# Patient Record
Sex: Female | Born: 1965 | Race: White | Hispanic: No | Marital: Married | Smoking: Never smoker
Health system: Southern US, Community
[De-identification: ages and names within clinical notes are randomized; demographics above are authoritative.]

## PROBLEM LIST (undated history)

## (undated) DIAGNOSIS — R509 Fever, unspecified: Secondary | ICD-10-CM

## (undated) DIAGNOSIS — G43909 Migraine, unspecified, not intractable, without status migrainosus: Secondary | ICD-10-CM

## (undated) DIAGNOSIS — Z87442 Personal history of urinary calculi: Secondary | ICD-10-CM

## (undated) DIAGNOSIS — E669 Obesity, unspecified: Secondary | ICD-10-CM

## (undated) DIAGNOSIS — R011 Cardiac murmur, unspecified: Secondary | ICD-10-CM

## (undated) DIAGNOSIS — M545 Low back pain, unspecified: Secondary | ICD-10-CM

## (undated) DIAGNOSIS — I10 Essential (primary) hypertension: Secondary | ICD-10-CM

## (undated) DIAGNOSIS — R609 Edema, unspecified: Secondary | ICD-10-CM

## (undated) DIAGNOSIS — J302 Other seasonal allergic rhinitis: Secondary | ICD-10-CM

## (undated) DIAGNOSIS — J189 Pneumonia, unspecified organism: Secondary | ICD-10-CM

## (undated) DIAGNOSIS — N950 Postmenopausal bleeding: Secondary | ICD-10-CM

## (undated) DIAGNOSIS — S99911A Unspecified injury of right ankle, initial encounter: Secondary | ICD-10-CM

## (undated) DIAGNOSIS — D649 Anemia, unspecified: Secondary | ICD-10-CM

## (undated) HISTORY — PX: CHOLECYSTECTOMY: SHX55

## (undated) HISTORY — DX: Essential (primary) hypertension: I10

---

## 2002-09-22 HISTORY — PX: TUBAL LIGATION: SHX77

## 2013-09-22 DIAGNOSIS — J189 Pneumonia, unspecified organism: Secondary | ICD-10-CM

## 2013-09-22 HISTORY — DX: Pneumonia, unspecified organism: J18.9

## 2017-11-02 ENCOUNTER — Ambulatory Visit (INDEPENDENT_AMBULATORY_CARE_PROVIDER_SITE_OTHER): Payer: BLUE CROSS/BLUE SHIELD | Admitting: Obstetrics & Gynecology

## 2017-11-02 ENCOUNTER — Ambulatory Visit (INDEPENDENT_AMBULATORY_CARE_PROVIDER_SITE_OTHER): Payer: BLUE CROSS/BLUE SHIELD

## 2017-11-02 ENCOUNTER — Encounter: Payer: Self-pay | Admitting: Obstetrics & Gynecology

## 2017-11-02 VITALS — BP 191/100 | Wt 279.0 lb

## 2017-11-02 DIAGNOSIS — Z124 Encounter for screening for malignant neoplasm of cervix: Secondary | ICD-10-CM

## 2017-11-02 DIAGNOSIS — Z1151 Encounter for screening for human papillomavirus (HPV): Secondary | ICD-10-CM

## 2017-11-02 DIAGNOSIS — Z01411 Encounter for gynecological examination (general) (routine) with abnormal findings: Secondary | ICD-10-CM

## 2017-11-02 DIAGNOSIS — N950 Postmenopausal bleeding: Secondary | ICD-10-CM

## 2017-11-02 DIAGNOSIS — R9389 Abnormal findings on diagnostic imaging of other specified body structures: Secondary | ICD-10-CM

## 2017-11-02 DIAGNOSIS — Z01419 Encounter for gynecological examination (general) (routine) without abnormal findings: Secondary | ICD-10-CM

## 2017-11-02 MED ORDER — MISOPROSTOL 200 MCG PO TABS
ORAL_TABLET | ORAL | 0 refills | Status: DC
Start: 2017-11-02 — End: 2017-12-11

## 2017-11-02 MED ORDER — MISOPROSTOL 200 MCG PO TABS
200.0000 ug | ORAL_TABLET | Freq: Four times a day (QID) | ORAL | 0 refills | Status: DC
Start: 2017-11-02 — End: 2017-11-02

## 2017-11-02 NOTE — Progress Notes (Signed)
Subjective:    Alyssa Cherry is a 52 y.o. married P4 (32, 43, 65, and 75 yo kids) female who presents for an annual exam. She had no bleeding/periods for "11 1/2 months" but has had an extended period of bleeding last month (10-02-17 through 10-20-17. The patient is sexually active (q Friday :)). GYN screening history: last pap: was normal. The patient wears seatbelts: yes. The patient participates in regular exercise: no. Has the patient ever been transfused or tattooed?: no. The patient reports that there is not domestic violence in her life.   Menstrual History: OB History    No data available      Menarche age: 56 Patient's last menstrual period was 10/02/2017.    The following portions of the patient's history were reviewed and updated as appropriate: allergies, current medications, past family history, past medical history, past social history, past surgical history and problem list.  Review of Systems Pertinent items are noted in HPI.   FH-+ breast cancer in her 40 yo mom, and mom's sister who died at 33 years old +ovarian cancer in paternal aunt, + colon cancer- brother died at 61 years old  She had a colonoscopy last year, normal I tied her tubes in 2004. 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 Flu shot already given 10/18 Objective:    Wt 279 lb (126.6 kg)   LMP 10/02/2017   General Appearance:    Alert, cooperative, no distress, appears stated age  Head:    Normocephalic, without obvious abnormality, atraumatic  Eyes:    PERRL, conjunctiva/corneas clear, EOM's intact, fundi    benign, both eyes  Ears:    Normal TM's and external ear canals, both ears  Nose:   Nares normal, septum midline, mucosa normal, no drainage    or sinus tenderness  Throat:   Lips, mucosa, and tongue normal; teeth and gums normal  Neck:   Supple, symmetrical, trachea midline, no adenopathy;    thyroid:  no enlargement/tenderness/nodules; no carotid   bruit or JVD  Back:      Symmetric, no curvature, ROM normal, no CVA tenderness  Lungs:     Clear to auscultation bilaterally, respirations unlabored  Chest Wall:    No tenderness or deformity   Heart:    Regular rate and rhythm, S1 and S2 normal, no murmur, rub   or gallop  Breast Exam:    No tenderness, masses, or nipple abnormality  Abdomen:     Soft, non-tender, bowel sounds active all four quadrants,    no masses, no organomegaly  Genitalia:    Normal female without lesion, discharge or tenderness     Extremities:   Extremities normal, atraumatic, no cyanosis or edema  Pulses:   2+ and symmetric all extremities  Skin:   Skin color, texture, turgor normal, no rashes or lesions  Lymph nodes:   Cervical, supraclavicular, and axillary nodes normal  Neurologic:   CNII-XII intact, normal strength, sensation and reflexes    throughout  .    Assessment:    Healthy female exam.   PMB   Plan:     Thin prep Pap smear. with cotesting Gyn u/s today Mammogram to be done at [facility] testing

## 2017-11-04 ENCOUNTER — Other Ambulatory Visit: Payer: Self-pay

## 2017-11-04 DIAGNOSIS — B379 Candidiasis, unspecified: Secondary | ICD-10-CM

## 2017-11-04 MED ORDER — FLUCONAZOLE 150 MG PO TABS: 150.0000 mg | ORAL_TABLET | Freq: Once | ORAL | 1 refills | Status: AC

## 2017-11-05 LAB — CYTOLOGY - PAP
DIAGNOSIS: NEGATIVE
HPV: NOT DETECTED

## 2017-11-20 ENCOUNTER — Ambulatory Visit (INDEPENDENT_AMBULATORY_CARE_PROVIDER_SITE_OTHER): Payer: BLUE CROSS/BLUE SHIELD | Admitting: Obstetrics & Gynecology

## 2017-11-20 ENCOUNTER — Other Ambulatory Visit (HOSPITAL_COMMUNITY)
Admission: RE | Admit: 2017-11-20 | Discharge: 2017-11-20 | Disposition: A | Discharge status: Home / Self Care | Payer: BLUE CROSS/BLUE SHIELD | Source: Ambulatory Visit | Attending: Obstetrics & Gynecology | Admitting: Obstetrics & Gynecology

## 2017-11-20 ENCOUNTER — Encounter: Payer: Self-pay | Admitting: Obstetrics & Gynecology

## 2017-11-20 VITALS — BP 167/92 | HR 84 | Wt 278.2 lb

## 2017-11-20 DIAGNOSIS — N950 Postmenopausal bleeding: Secondary | ICD-10-CM

## 2017-11-20 DIAGNOSIS — N8501 Benign endometrial hyperplasia: Secondary | ICD-10-CM | POA: Diagnosis not present

## 2017-11-20 NOTE — Progress Notes (Signed)
   Subjective:    Patient ID: Alyssa Cherry, female    DOB: July 17, 1966, 52 y.o.   MRN: [id number]  HPI  52 yo married P4 here from [place], [place] for an embx due to PMB. Her u/s showed a 9 mm endometrium.  Review of Systems     Objective:   Physical Exam Breathing, conversing, and ambulating normally Well nourished, well hydrated White female, no apparent distress  UPT negative, consent signed, time out done Cervix prepped with betadine and grasped with a single tooth tenaculum Uterus sounded to 9 cm Pipelle used for 2 passes with a moderate amount of tissue obtained. She tolerated the procedure well.     Assessment & Plan:  PMB- await patholgy

## 2017-11-23 ENCOUNTER — Encounter: Payer: Self-pay | Admitting: *Deleted

## 2017-11-30 ENCOUNTER — Telehealth: Payer: Self-pay | Admitting: *Deleted

## 2017-11-30 NOTE — Telephone Encounter (Signed)
-----   Message from Emily Filbert, MD sent at 11/27/2017  8:55 AM EST ----- Please make her an appt with gyn onc. Thanks She is aware.

## 2017-11-30 NOTE — Telephone Encounter (Signed)
Appt made with Dr Everitt Amber 12/11/17 @ 9AM check in @ 8:45  @ the [facility]  [place]

## 2017-12-01 ENCOUNTER — Encounter: Payer: Self-pay | Admitting: *Deleted

## 2017-12-01 ENCOUNTER — Telehealth: Payer: Self-pay | Admitting: *Deleted

## 2017-12-01 NOTE — Addendum Note (Signed)
Addended by: Asencion Islam on: 12/01/2017 08:59 AM   Modules accepted: Orders

## 2017-12-01 NOTE — Telephone Encounter (Signed)
Neg [facility] results received and a copy mailed to pt's home address and forwarded to Dr Hulan Fray for review and sign off.

## 2017-12-10 ENCOUNTER — Telehealth: Payer: Self-pay | Admitting: Obstetrics & Gynecology

## 2017-12-10 ENCOUNTER — Telehealth: Payer: Self-pay | Admitting: *Deleted

## 2017-12-10 NOTE — Telephone Encounter (Signed)
Alyssa Cherry at Dr. Terrence Dupont Rossi's office called to confirm pt appt with [name] tomorrow 12/11/17 @ 12:00 pm. Per Sharyn Lull pt as been notified.

## 2017-12-10 NOTE — Telephone Encounter (Signed)
Returned the patient and gave her the new date/time of March 22nd at 12pm. Patient's appt was to be March 22nd at [facility], but computer posted appt for April 3rd. Called and gave Dr. Alease Medina office the appt for tomorrow.

## 2017-12-11 ENCOUNTER — Encounter: Payer: Self-pay | Admitting: Gynecologic Oncology

## 2017-12-11 ENCOUNTER — Inpatient Hospital Stay: Payer: BLUE CROSS/BLUE SHIELD | Attending: Gynecologic Oncology | Admitting: Gynecologic Oncology

## 2017-12-11 ENCOUNTER — Inpatient Hospital Stay: Payer: BLUE CROSS/BLUE SHIELD

## 2017-12-11 VITALS — BP 150/100 | HR 88 | Temp 98.7°F | Resp 20 | Ht 64.0 in | Wt 284.0 lb

## 2017-12-11 DIAGNOSIS — L918 Other hypertrophic disorders of the skin: Secondary | ICD-10-CM | POA: Insufficient documentation

## 2017-12-11 DIAGNOSIS — N8502 Endometrial intraepithelial neoplasia [EIN]: Secondary | ICD-10-CM | POA: Insufficient documentation

## 2017-12-11 DIAGNOSIS — Z6841 Body Mass Index (BMI) 40.0 and over, adult: Secondary | ICD-10-CM | POA: Diagnosis not present

## 2017-12-11 LAB — HEMOGLOBIN A1C
HEMOGLOBIN A1C: 5.3 % (ref 4.8–5.6)
Mean Plasma Glucose: 105.41 mg/dL

## 2017-12-11 MED ORDER — MEGESTROL ACETATE 40 MG PO TABS: 40.0000 mg | ORAL_TABLET | Freq: Two times a day (BID) | ORAL | 0 refills | Status: DC

## 2017-12-11 NOTE — Patient Instructions (Signed)
Preparing for your Surgery  Plan for surgery on January 19, 2018 with Dr. Everitt Amber at [place] will be scheduled for a robotic assisted total hysterectomy, bilateral salpingo-oophorectomy, sentinel lymph node biopsy, removal of skin tag from left thigh.  We will be checking a hemoglobin A1C that will show a picture of your blood sugar over a three month period.  Pre-operative Testing -You will receive a phone call from presurgical testing at [facility] to arrange for a pre-operative testing appointment before your surgery.  This appointment normally occurs one to two weeks before your scheduled surgery.   -Bring your insurance card, copy of an advanced directive if applicable, medication list  -At that visit, you will be asked to sign a consent for a possible blood transfusion in case a transfusion becomes necessary during surgery.  The need for a blood transfusion is rare but having consent is a necessary part of your care.     -You should not be taking blood thinners or aspirin at least ten days prior to surgery unless instructed by your surgeon.  Day Before Surgery at [place] will be asked to take in a light diet the day before surgery.  Avoid carbonated beverages.  You will be advised to have nothing to eat or drink after midnight the evening before.    Eat a light diet the day before surgery.  Examples including soups, broths, toast, yogurt, mashed potatoes.  Things to avoid include carbonated beverages (fizzy beverages), raw fruits and raw vegetables, or beans.   If your bowels are filled with gas, your surgeon will have difficulty visualizing your pelvic organs which increases your surgical risks.  Your role in recovery Your role is to become active as soon as directed by your doctor, while still giving yourself time to heal.  Rest when you feel tired. You will be asked to do the following in order to speed your recovery:  - Cough and  breathe deeply. This helps toclear and expand your lungs and can prevent pneumonia. You may be given a spirometer to practice deep breathing. A staff member will show you how to use the spirometer. - Do mild physical activity. Walking or moving your legs help your circulation and body functions return to normal. A staff member will help you when you try to walk and will provide you with simple exercises. Do not try to get up or walk alone the first time. - Actively manage your pain. Managing your pain lets you move in comfort. We will ask you to rate your pain on a scale of zero to 10. It is your responsibility to tell your doctor or nurse where and how much you hurt so your pain can be treated.  Special Considerations -If you are diabetic, you may be placed on insulin after surgery to have closer control over your blood sugars to promote healing and recovery.  This does not mean that you will be discharged on insulin.  If applicable, your oral antidiabetics will be resumed when you are tolerating a solid diet.  -Your final pathology results from surgery should be available by the Friday after surgery and the results will be relayed to you when available.  -Dr. Lahoma Crocker is the Surgeon that assists your GYN Oncologist with surgery.  The next day after your surgery you will either see your GYN Oncologist or Dr. Lahoma Crocker.   Blood Transfusion Information WHAT IS A BLOOD TRANSFUSION? A transfusion is the replacement  of blood or some of its parts. Blood is made up of multiple cells which provide different functions.  Red blood cells carry oxygen and are used for blood loss replacement.  White blood cells fight against infection.  Platelets control bleeding.  Plasma helps clot blood.  Other blood products are available for specialized needs, such as hemophilia or other clotting disorders. BEFORE THE TRANSFUSION  Who gives blood for transfusions?   You may be able to donate  blood to be used at a later date on yourself (autologous donation).  Relatives can be asked to donate blood. This is generally not any safer than if you have received blood from a stranger. The same precautions are taken to ensure safety when a relative's blood is donated.  Healthy volunteers who are fully evaluated to make sure their blood is safe. This is blood bank blood. Transfusion therapy is the safest it has ever been in the practice of medicine. Before blood is taken from a donor, a complete history is taken to make sure that person has no history of diseases nor engages in risky social behavior (examples are intravenous drug use or sexual activity with multiple partners). The donor's travel history is screened to minimize risk of transmitting infections, such as malaria. The donated blood is tested for signs of infectious diseases, such as HIV and hepatitis. The blood is then tested to be sure it is compatible with you in order to minimize the chance of a transfusion reaction. If you or a relative donates blood, this is often done in anticipation of surgery and is not appropriate for emergency situations. It takes many days to process the donated blood. RISKS AND COMPLICATIONS Although transfusion therapy is very safe and saves many lives, the main dangers of transfusion include:   Getting an infectious disease.  Developing a transfusion reaction. This is an allergic reaction to something in the blood you were given. Every precaution is taken to prevent this. The decision to have a blood transfusion has been considered carefully by your caregiver before blood is given. Blood is not given unless the benefits outweigh the risks.

## 2017-12-11 NOTE — Progress Notes (Signed)
Consult Note: Gyn-Onc  Consult was requested by Dr. Hulan Fray for the evaluation of Alyssa Cherry 52 y.o. female  CC:  Chief Complaint  Patient presents with  . Endometrial hyperplasia with atypia    Assessment/Plan:  Alyssa Cherry  is a 52 y.o.  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   I asked with her that is a 30% risk for progression endometrial cancer with this condition.  Discussed that there is a 40% risk for occult endometrial cancer being present.  I discussed that the treatment for complex atypical hyperplasia includes other medical therapy with progestins, or definitive surgical management with hysterectomy.  I discussed that progestin therapy has fewer risks, however is less definitive.  I discussed that surgery is associated with significant risk including  bleeding, infection, damage to internal organs (such as bladder,ureters, bowels), blood clot, reoperation and rehospitalization.  Discussed that these risks are elevated in a patient with morbid obesity.  After hearing options for treatment she is electing for surgery with robotic assisted total hysterectomy and BSO with sentinel lymph node biopsy.  I will prescribe progestin therapy 40 mg of Megace twice daily until the time of surgery to control bleeding symptoms.  She has cutaneous skin tags, 1 of which is on her medial proximal left thigh.  I will excise this is part of her surgical procedure.  I discussed that there is an increased risk of surgical site infection when performing joint procedures with hysterectomy due to the contaminated nature of hysterectomy procedure.  HPI: [name] is a 52 year old P4 who is seen in consultation at the request of Dr Hulan Fray for complex endometrial hyperplasia.  The patient has a history of abnormal uterine bleeding with mental menorrhagia for approximately 8 years, worse in the past 11 months when she is only had 4 menstrual cycles.   She was then seen and evaluated by Dr. Hulan Fray for this on November 02, 2017 and a transvaginal ultrasound scan was performed which measured a uterus of 8.9 x 4.8 x 5.4 cm that was anteverted and mildly enlarged, there is a 9 mm endometrial thickness.  The right ovary was not visualized the left ovary was grossly normal.  Patient has unusual symptoms with menstrual cycles including right eye swelling.  Her past surgical history is significant for a cholecystectomy laparoscopically in 2018.  She has hypertension but denies having diabetes.  She has had 4 prior vaginal deliveries.  She works as a Copywriter, advertising.  Her family history is very significant for a brother who died of colon cancer, mother had ovarian cancer, and a paternal aunt who had breast cancer.  She was tested for an MV tape panel which included Brekke, and Lynch syndrome assessment and this was negative.  The patient is morbidly obese with a BMI of 48 kg/m.  She has multiple skin tags which are bothersome mostly in the upper chest but also one on the medial upper left thigh.  Current Meds:  Outpatient Encounter Medications as of 12/11/2017  Medication Sig  . losartan (COZAAR) 25 MG tablet Take 25 mg by mouth daily.  . naproxen sodium (ALEVE) 220 MG tablet Take 220 mg by mouth 2 (two) times daily as needed.  . megestrol (MEGACE) 40 MG tablet Take 1 tablet (40 mg total) by mouth 2 (two) times daily.  . phentermine 37.5 MG capsule Take 37.5 mg by mouth every morning.  . [DISCONTINUED] misoprostol (CYTOTEC) 200 MCG tablet  Take 3 pills by mouth the night before biopsy.   No facility-administered encounter medications on file as of 12/11/2017.     Allergy:  Allergies  Allergen Reactions  . Sulfa Antibiotics Rash    Social Hx:   Social History   Socioeconomic History  . Marital status: Married    Spouse name: Not on file  . Number of children: Not on file  . Years of education: Not on file  . Highest education level: Not on  file  Occupational History  . Not on file  Social Needs  . Financial resource strain: Not on file  . Food insecurity:    Worry: Not on file    Inability: Not on file  . Transportation needs:    Medical: Not on file    Non-medical: Not on file  Tobacco Use  . Smoking status: Never Smoker  . Smokeless tobacco: Never Used  Substance and Sexual Activity  . Alcohol use: No    Frequency: Never  . Drug use: No  . Sexual activity: Yes  Lifestyle  . Physical activity:    Days per week: Not on file    Minutes per session: Not on file  . Stress: Not on file  Relationships  . Social connections:    Talks on phone: Not on file    Gets together: Not on file    Attends religious service: Not on file    Active member of club or organization: Not on file    Attends meetings of clubs or organizations: Not on file    Relationship status: Not on file  . Intimate partner violence:    Fear of current or ex partner: Not on file    Emotionally abused: Not on file    Physically abused: Not on file    Forced sexual activity: Not on file  Other Topics Concern  . Not on file  Social History Narrative  . Not on file    Past Surgical Hx:  Past Surgical History:  Procedure Laterality Date  . CHOLECYSTECTOMY      Past Medical Hx:  Past Medical History:  Diagnosis Date  . Hypertension     Past Gynecological History:  SVD x 4 No LMP recorded.  Family Hx:  Family History  Problem Relation Age of Onset  . Breast cancer Mother   . Colon cancer Brother   . Ovarian cancer Paternal Aunt     Review of Systems:  Constitutional  Feels well,    ENT Normal appearing ears and nares bilaterally Skin/Breast  No rash, sores, jaundice, itching, dryness Cardiovascular  No chest pain, shortness of breath, or edema  Pulmonary  No cough or wheeze.  Gastro Intestinal  No nausea, vomitting, or diarrhoea. No bright red blood per rectum, no abdominal pain, change in bowel movement, or  constipation.  Genito Urinary  No frequency, urgency, dysuria, + postmenopausal bleeding Musculo Skeletal  No myalgia, arthralgia, joint swelling or pain  Neurologic  No weakness, numbness, change in gait,  Psychology  No depression, anxiety, insomnia.   Vitals:  Blood pressure (!) 150/100, pulse 88, temperature 98.7 F (37.1 C), temperature source Oral, resp. rate 20, height 5\' 4"  (1.626 m), weight 284 lb (128.8 kg), SpO2 100 %.  Physical Exam: WD in NAD Neck  Supple NROM, without any enlargements.  Lymph Node Survey No cervical supraclavicular or inguinal adenopathy Cardiovascular  Pulse normal rate, regularity and rhythm. S1 and S2 normal.  Lungs  Clear to auscultation bilateraly, without  wheezes/crackles/rhonchi. Good air movement.  Skin  No rash/lesions/breakdown  Psychiatry  Alert and oriented to person, place, and time  Abdomen  Normoactive bowel sounds, abdomen soft, non-tender and obese without evidence of hernia.  Back No CVA tenderness Genito Urinary  Vulva/vagina: Normal external female genitalia.   No lesions. No discharge or bleeding.  Bladder/urethra:  No lesions or masses, prolapse of bladder  Vagina: prolapse present  Cervix: Normal appearing, no lesions.  Uterus:  Small, mobile, no parametrial involvement or nodularity.  Adnexa: no palpable masses. Rectal  deferred Extremities  No bilateral cyanosis, clubbing or edema.   Thereasa Solo, MD  12/11/2017, 12:31 PM

## 2017-12-14 ENCOUNTER — Telehealth: Payer: Self-pay

## 2017-12-14 NOTE — Telephone Encounter (Signed)
LM stating that her HgbA1c was good in normal range on 12-11-17 at 5.3

## 2017-12-23 ENCOUNTER — Ambulatory Visit: Payer: BLUE CROSS/BLUE SHIELD | Admitting: Gynecologic Oncology

## 2018-01-11 ENCOUNTER — Encounter: Payer: Self-pay | Admitting: Gynecologic Oncology

## 2018-01-14 ENCOUNTER — Encounter (HOSPITAL_COMMUNITY): Payer: Self-pay

## 2018-01-14 NOTE — Pre-Procedure Instructions (Signed)
Hgb A 1C (5.3) 12/11/2017 in epic.

## 2018-01-14 NOTE — Patient Instructions (Addendum)
Your procedure is scheduled on: Tuesday, January 19, 2018    Report to [facility] Main  Entrance    Report to admitting at 12 PM   Call this number if you have problems the morning of surgery [phone]   Eat a light diet the day before surgery. Examples including soups, broths, toast, yogurt, mashed potatoes. Things to avoid include carbonated  beverages (fizzy beverages), raw fruits and raw vegetables, or beans.    If your bowels are filled with gas, your surgeon will have difficulty visualizing your pelvic organs which increases your surgical risks.   Do not eat food:After Midnight. You may have clear liquids from midnight until 8:30AM morning of surgery   CLEAR LIQUID DIET   Foods Allowed                                                                     Foods Excluded  Water Coffee and tea, regular and decaf                             liquids that you cannot  Plain Jell-O in any flavor                                             see through such as: Fruit ices (not with fruit pulp)                                     milk, soups, orange juice  Iced Popsicles                                    All solid food                                    Cranberry, grape and apple juices Sports drinks like Gatorade Lightly seasoned clear broth or consume(fat free) Sugar, honey syrup  Sample Menu Breakfast                                Lunch                                     Supper Cranberry juice                    Beef broth                            Chicken broth Jell-O                                     Grape juice  Apple juice Coffee or tea                        Jell-O                                      Popsicle                                                Coffee or tea                        Coffee or tea    Take these medicines the morning of surgery with A SIP OF WATER:  None                                You may not have  any metal on your body including hair pins, jewelry, and body piercings             Do not wear make-up, lotions, powders, perfumes/cologne, or deodorant             Do not wear nail polish.  Do not shave  48 hours prior to surgery.              Do not bring valuables to the hospital. [place].   Contacts, dentures or bridgework may not be worn into surgery.   Leave suitcase in the car. After surgery it may be brought to your room.                Please read over the following fact sheets you were given:   [facility] - Preparing for Surgery Before surgery, you can play an important role.  Because skin is not sterile, your skin needs to be as free of germs as possible.  You can reduce the number of germs on your skin by washing with CHG (chlorahexidine gluconate) soap before surgery.  CHG is an antiseptic cleaner which kills germs and bonds with the skin to continue killing germs even after washing. Please DO NOT use if you have an allergy to CHG or antibacterial soaps.  If your skin becomes reddened/irritated stop using the CHG and inform your nurse when you arrive at Short Stay. Do not shave (including legs and underarms) for at least 48 hours prior to the first CHG shower.  You may shave your face/neck.  Please follow these instructions carefully:  1.  Shower with CHG Soap the night before surgery and the  morning of surgery.  2.  If you choose to wash your hair, wash your hair first as usual with your normal  shampoo.  3.  After you shampoo, rinse your hair and body thoroughly to remove the shampoo.                             4.  Use CHG as you would any other liquid soap.  You can apply chg directly to the skin and wash.  Gently with a scrungie or clean washcloth.  5.  Apply the CHG Soap to your body ONLY FROM THE NECK DOWN.   Do not use on face/ open                           Wound or open sores. Avoid contact with eyes, ears mouth  and genitals (private parts).                       Wash face,  Genitals (private parts) with your normal soap.             6.  Wash thoroughly, paying special attention to the area where your surgery  will be performed.  7.  Thoroughly rinse your body with warm water from the neck down.  8.  DO NOT shower/wash with your normal soap after using and rinsing off the CHG Soap.                9.  Pat yourself dry with a clean towel.            10.  Wear clean pajamas.            11.  Place clean sheets on your bed the night of your first shower and do not  sleep with pets.  Day of Surgery : Do not apply any lotions/deodorants the morning of surgery.  Please wear clean clothes to the hospital/surgery center.  FAILURE TO FOLLOW THESE INSTRUCTIONS MAY RESULT IN THE CANCELLATION OF YOUR SURGERY  PATIENT SIGNATURE_________________________________  NURSE SIGNATURE__________________________________  ________________________________________________________________________   Alyssa Cherry  An incentive spirometer is a tool that can help keep your lungs clear and active. This tool measures how well you are filling your lungs with each breath. Taking long deep breaths may help reverse or decrease the chance of developing breathing (pulmonary) problems (especially infection) following:  A long period of time when you are unable to move or be active. BEFORE THE PROCEDURE   If the spirometer includes an indicator to show your best effort, your nurse or respiratory therapist will set it to a desired goal.  If possible, sit up straight or lean slightly forward. Try not to slouch.  Hold the incentive spirometer in an upright position. INSTRUCTIONS FOR USE  1. Sit on the edge of your bed if possible, or sit up as far as you can in bed or on a chair. 2. Hold the incentive spirometer in an upright position. 3. Breathe out normally. 4. Place the mouthpiece in your mouth and seal your lips tightly  around it. 5. Breathe in slowly and as deeply as possible, raising the piston or the ball toward the top of the column. 6. Hold your breath for 3-5 seconds or for as long as possible. Allow the piston or ball to fall to the bottom of the column. 7. Remove the mouthpiece from your mouth and breathe out normally. 8. Rest for a few seconds and repeat Steps 1 through 7 at least 10 times every 1-2 hours when you are awake. Take your time and take a few normal breaths between deep breaths. 9. The spirometer may include an indicator to show your best effort. Use the indicator as a goal to work toward during each repetition. 10. After each set of 10 deep breaths, practice coughing to be sure your lungs are clear. If you have an incision (the cut made at the time of surgery), support your incision when coughing by  placing a pillow or rolled up towels firmly against it. Once you are able to get out of bed, walk around indoors and cough well. You may stop using the incentive spirometer when instructed by your caregiver.  RISKS AND COMPLICATIONS  Take your time so you do not get dizzy or light-headed.  If you are in pain, you may need to take or ask for pain medication before doing incentive spirometry. It is harder to take a deep breath if you are having pain. AFTER USE  Rest and breathe slowly and easily.  It can be helpful to keep track of a log of your progress. Your caregiver can provide you with a simple table to help with this. If you are using the spirometer at home, follow these instructions: [place] IF:   You are having difficultly using the spirometer.  You have trouble using the spirometer as often as instructed.  Your pain medication is not giving enough relief while using the spirometer.  You develop fever of 100.5 F (38.1 C) or higher. SEEK IMMEDIATE MEDICAL CARE IF:   You cough up bloody sputum that had not been present before.  You develop fever of 102 F (38.9 C) or  greater.  You develop worsening pain at or near the incision site. MAKE SURE YOU:   Understand these instructions.  Will watch your condition.  Will get help right away if you are not doing well or get worse. Document Released: 01/19/2007 Document Revised: 12/01/2011 Document Reviewed: 03/22/2007 ExitCare Patient Information 2014 Exit[employer], [place].   ________________________________________________________________________  WHAT IS A BLOOD TRANSFUSION? Blood Transfusion Information  A transfusion is the replacement of blood or some of its parts. Blood is made up of multiple cells which provide different functions.  Red blood cells carry oxygen and are used for blood loss replacement.  White blood cells fight against infection.  Platelets control bleeding.  Plasma helps clot blood.  Other blood products are available for specialized needs, such as hemophilia or other clotting disorders. BEFORE THE TRANSFUSION  Who gives blood for transfusions?   Healthy volunteers who are fully evaluated to make sure their blood is safe. This is blood bank blood. Transfusion therapy is the safest it has ever been in the practice of medicine. Before blood is taken from a donor, a complete history is taken to make sure that person has no history of diseases nor engages in risky social behavior (examples are intravenous drug use or sexual activity with multiple partners). The donor's travel history is screened to minimize risk of transmitting infections, such as malaria. The donated blood is tested for signs of infectious diseases, such as HIV and hepatitis. The blood is then tested to be sure it is compatible with you in order to minimize the chance of a transfusion reaction. If you or a relative donates blood, this is often done in anticipation of surgery and is not appropriate for emergency situations. It takes many days to process the donated blood. RISKS AND COMPLICATIONS Although transfusion therapy  is very safe and saves many lives, the main dangers of transfusion include:   Getting an infectious disease.  Developing a transfusion reaction. This is an allergic reaction to something in the blood you were given. Every precaution is taken to prevent this. The decision to have a blood transfusion has been considered carefully by your caregiver before blood is given. Blood is not given unless the benefits outweigh the risks. AFTER THE TRANSFUSION  Right after receiving a blood  transfusion, you will usually feel much better and more energetic. This is especially true if your red blood cells have gotten low (anemic). The transfusion raises the level of the red blood cells which carry oxygen, and this usually causes an energy increase.  The nurse administering the transfusion will monitor you carefully for complications. HOME CARE INSTRUCTIONS  No special instructions are needed after a transfusion. You may find your energy is better. Speak with your caregiver about any limitations on activity for underlying diseases you may have. SEEK MEDICAL CARE IF:   Your condition is not improving after your transfusion.  You develop redness or irritation at the intravenous (IV) site. SEEK IMMEDIATE MEDICAL CARE IF:  Any of the following symptoms occur over the next 12 hours:  Shaking chills.  You have a temperature by mouth above 102 F (38.9 C), not controlled by medicine.  Chest, back, or muscle pain.  People around you feel you are not acting correctly or are confused.  Shortness of breath or difficulty breathing.  Dizziness and fainting.  You get a rash or develop hives.  You have a decrease in urine output.  Your urine turns a dark color or changes to pink, red, or brown. Any of the following symptoms occur over the next 10 days:  You have a temperature by mouth above 102 F (38.9 C), not controlled by medicine.  Shortness of breath.  Weakness after normal activity.  The white  part of the eye turns yellow (jaundice).  You have a decrease in the amount of urine or are urinating less often.  Your urine turns a dark color or changes to pink, red, or brown. Document Released: 09/05/2000 Document Revised: 12/01/2011 Document Reviewed: 04/24/2008 [facility] Patient Information 2014 [place], [place].  _______________________________________________________________________

## 2018-01-15 ENCOUNTER — Encounter (HOSPITAL_COMMUNITY): Payer: Self-pay

## 2018-01-15 ENCOUNTER — Telehealth: Payer: Self-pay

## 2018-01-15 ENCOUNTER — Encounter (HOSPITAL_COMMUNITY)
Admission: RE | Admit: 2018-01-15 | Discharge: 2018-01-15 | Disposition: A | Discharge status: Home / Self Care | Payer: BLUE CROSS/BLUE SHIELD | Source: Ambulatory Visit | Attending: Gynecologic Oncology | Admitting: Gynecologic Oncology

## 2018-01-15 ENCOUNTER — Other Ambulatory Visit (HOSPITAL_COMMUNITY): Payer: BLUE CROSS/BLUE SHIELD

## 2018-01-15 ENCOUNTER — Other Ambulatory Visit: Payer: Self-pay

## 2018-01-15 DIAGNOSIS — Z01812 Encounter for preprocedural laboratory examination: Secondary | ICD-10-CM | POA: Diagnosis not present

## 2018-01-15 DIAGNOSIS — I10 Essential (primary) hypertension: Secondary | ICD-10-CM | POA: Insufficient documentation

## 2018-01-15 DIAGNOSIS — Z01810 Encounter for preprocedural cardiovascular examination: Secondary | ICD-10-CM | POA: Diagnosis not present

## 2018-01-15 HISTORY — DX: Cardiac murmur, unspecified: R01.1

## 2018-01-15 HISTORY — DX: Obesity, unspecified: E66.9

## 2018-01-15 HISTORY — DX: Low back pain: M54.5

## 2018-01-15 HISTORY — DX: Pneumonia, unspecified organism: J18.9

## 2018-01-15 HISTORY — DX: Personal history of urinary calculi: Z87.442

## 2018-01-15 HISTORY — DX: Fever, unspecified: R50.9

## 2018-01-15 HISTORY — DX: Edema, unspecified: R60.9

## 2018-01-15 HISTORY — DX: Unspecified injury of right ankle, initial encounter: S99.911A

## 2018-01-15 HISTORY — DX: Postmenopausal bleeding: N95.0

## 2018-01-15 HISTORY — DX: Anemia, unspecified: D64.9

## 2018-01-15 HISTORY — DX: Migraine, unspecified, not intractable, without status migrainosus: G43.909

## 2018-01-15 HISTORY — DX: Other seasonal allergic rhinitis: J30.2

## 2018-01-15 HISTORY — DX: Low back pain, unspecified: M54.50

## 2018-01-15 LAB — COMPREHENSIVE METABOLIC PANEL
ALBUMIN: 3.5 g/dL (ref 3.5–5.0)
ALT: 16 U/L (ref 14–54)
AST: 15 U/L (ref 15–41)
Alkaline Phosphatase: 75 U/L (ref 38–126)
Anion gap: 10 (ref 5–15)
BILIRUBIN TOTAL: 0.4 mg/dL (ref 0.3–1.2)
BUN: 12 mg/dL (ref 6–20)
CO2: 23 mmol/L (ref 22–32)
Calcium: 9 mg/dL (ref 8.9–10.3)
Chloride: 110 mmol/L (ref 101–111)
Creatinine, Ser: 0.89 mg/dL (ref 0.44–1.00)
GFR calc Af Amer: >60 mL/min (ref >60)
GFR calc non Af Amer: >60 mL/min (ref >60)
Glucose, Bld: 100 mg/dL — ABNORMAL HIGH (ref 65–99)
POTASSIUM: 3.3 mmol/L — AB (ref 3.5–5.1)
Sodium: 143 mmol/L (ref 135–145)
TOTAL PROTEIN: 7.3 g/dL (ref 6.5–8.1)

## 2018-01-15 LAB — CBC
HEMATOCRIT: 38.7 % (ref 36.0–46.0)
HEMOGLOBIN: 12.6 g/dL (ref 12.0–15.0)
MCH: 25.9 pg — ABNORMAL LOW (ref 26.0–34.0)
MCHC: 32.6 g/dL (ref 30.0–36.0)
MCV: 79.5 fL (ref 78.0–100.0)
Platelets: 310 10*3/uL (ref 150–400)
RBC: 4.87 MIL/uL (ref 3.87–5.11)
RDW: 14.8 % (ref 11.5–15.5)
WBC: 10.1 10*3/uL (ref 4.0–10.5)

## 2018-01-15 LAB — URINALYSIS, ROUTINE W REFLEX MICROSCOPIC
BILIRUBIN URINE: NEGATIVE
Glucose, UA: NEGATIVE mg/dL
Ketones, ur: NEGATIVE mg/dL
NITRITE: NEGATIVE
PH: 6 (ref 5.0–8.0)
Protein, ur: NEGATIVE mg/dL
SPECIFIC GRAVITY, URINE: 1.018 (ref 1.005–1.030)

## 2018-01-15 LAB — PREGNANCY, URINE: Preg Test, Ur: NEGATIVE

## 2018-01-15 LAB — ABO/RH: ABO/RH(D): O POS

## 2018-01-15 NOTE — Telephone Encounter (Signed)
LM for Alyssa Cherry stating that her potassium was  A little low on her pre-op labs at 3.3.  She needs to increase potassium in her diet between now and her surgery on 01-19-18. Examples of potassium rich foods are bananas, OJ, straw3berries, and cantaloupe.

## 2018-01-15 NOTE — Progress Notes (Signed)
Urinalysis routed to dr Everitt Amber via epic

## 2018-01-18 MED ORDER — DEXTROSE 5 % IV SOLN
3.0000 g | INTRAVENOUS | Status: AC
  Administered 2018-01-19: 3 g via INTRAVENOUS
  Filled 2018-01-18: qty 3

## 2018-01-19 ENCOUNTER — Other Ambulatory Visit: Payer: Self-pay

## 2018-01-19 ENCOUNTER — Encounter (HOSPITAL_COMMUNITY)
Admission: RE | Disposition: A | Discharge status: Home / Self Care | Payer: Self-pay | Source: Ambulatory Visit | Attending: Gynecologic Oncology

## 2018-01-19 ENCOUNTER — Encounter (HOSPITAL_COMMUNITY): Payer: Self-pay | Admitting: Certified Registered Nurse Anesthetist

## 2018-01-19 ENCOUNTER — Ambulatory Visit (HOSPITAL_COMMUNITY): Payer: BLUE CROSS/BLUE SHIELD | Admitting: Certified Registered Nurse Anesthetist

## 2018-01-19 ENCOUNTER — Ambulatory Visit (HOSPITAL_COMMUNITY)
Admission: RE | Admit: 2018-01-19 | Discharge: 2018-01-20 | Disposition: A | Discharge status: Home / Self Care | Payer: BLUE CROSS/BLUE SHIELD | Source: Ambulatory Visit | Attending: Gynecologic Oncology | Admitting: Gynecologic Oncology

## 2018-01-19 DIAGNOSIS — D259 Leiomyoma of uterus, unspecified: Secondary | ICD-10-CM | POA: Diagnosis not present

## 2018-01-19 DIAGNOSIS — Z6841 Body Mass Index (BMI) 40.0 and over, adult: Secondary | ICD-10-CM | POA: Insufficient documentation

## 2018-01-19 DIAGNOSIS — N8502 Endometrial intraepithelial neoplasia [EIN]: Secondary | ICD-10-CM

## 2018-01-19 DIAGNOSIS — L918 Other hypertrophic disorders of the skin: Secondary | ICD-10-CM | POA: Diagnosis present

## 2018-01-19 DIAGNOSIS — Z882 Allergy status to sulfonamides status: Secondary | ICD-10-CM | POA: Insufficient documentation

## 2018-01-19 DIAGNOSIS — N72 Inflammatory disease of cervix uteri: Secondary | ICD-10-CM | POA: Insufficient documentation

## 2018-01-19 DIAGNOSIS — N800 Endometriosis of uterus: Secondary | ICD-10-CM | POA: Insufficient documentation

## 2018-01-19 DIAGNOSIS — N838 Other noninflammatory disorders of ovary, fallopian tube and broad ligament: Secondary | ICD-10-CM | POA: Insufficient documentation

## 2018-01-19 DIAGNOSIS — I10 Essential (primary) hypertension: Secondary | ICD-10-CM | POA: Diagnosis not present

## 2018-01-19 DIAGNOSIS — Z79899 Other long term (current) drug therapy: Secondary | ICD-10-CM | POA: Diagnosis not present

## 2018-01-19 DIAGNOSIS — N888 Other specified noninflammatory disorders of cervix uteri: Secondary | ICD-10-CM | POA: Diagnosis not present

## 2018-01-19 HISTORY — PX: EXCISION OF SKIN TAG: SHX6270

## 2018-01-19 HISTORY — PX: ROBOTIC ASSISTED SUPRACERVICAL HYSTERECTOMY WITH BILATERAL SALPINGO OOPHERECTOMY: SHX6084

## 2018-01-19 LAB — TYPE AND SCREEN
ABO/RH(D): O POS
Antibody Screen: NEGATIVE

## 2018-01-19 SURGERY — HYSTERECTOMY, SUPRACERVICAL, ROBOT-ASSISTED, LAPAROSCOPIC, WITH BILATERAL SALPINGECTOMY
Anesthesia: General | Site: Thigh | Laterality: Left

## 2018-01-19 MED ORDER — TRAMADOL HCL 50 MG PO TABS
100.0000 mg | ORAL_TABLET | Freq: Four times a day (QID) | ORAL | Status: DC
  Administered 2018-01-19 – 2018-01-20 (×3): 100 mg via ORAL
  Filled 2018-01-19 (×3): qty 2

## 2018-01-19 MED ORDER — PHENYLEPHRINE 40 MCG/ML (10ML) SYRINGE FOR IV PUSH (FOR BLOOD PRESSURE SUPPORT)
PREFILLED_SYRINGE | INTRAVENOUS | Status: DC | PRN
  Administered 2018-01-19: 80 ug via INTRAVENOUS

## 2018-01-19 MED ORDER — MEPERIDINE HCL 50 MG/ML IJ SOLN: 6.2500 mg | INTRAMUSCULAR | Status: DC | PRN

## 2018-01-19 MED ORDER — ACETAMINOPHEN 500 MG PO TABS
1000.0000 mg | ORAL_TABLET | Freq: Four times a day (QID) | ORAL | Status: DC
  Administered 2018-01-20 (×2): 1000 mg via ORAL
  Filled 2018-01-19 (×2): qty 2

## 2018-01-19 MED ORDER — ONDANSETRON HCL 4 MG/2ML IJ SOLN: 4.0000 mg | Freq: Once | INTRAMUSCULAR | Status: DC | PRN

## 2018-01-19 MED ORDER — SUGAMMADEX SODIUM 200 MG/2ML IV SOLN
INTRAVENOUS | Status: DC | PRN
  Administered 2018-01-19: 300 mg via INTRAVENOUS

## 2018-01-19 MED ORDER — FENTANYL CITRATE (PF) 100 MCG/2ML IJ SOLN
INTRAMUSCULAR | Status: AC
  Administered 2018-01-19: 50 ug via INTRAVENOUS
  Filled 2018-01-19: qty 4

## 2018-01-19 MED ORDER — SUGAMMADEX SODIUM 500 MG/5ML IV SOLN
INTRAVENOUS | Status: AC
Start: 2018-01-19 — End: ?
  Filled 2018-01-19: qty 5

## 2018-01-19 MED ORDER — LIDOCAINE 2% (20 MG/ML) 5 ML SYRINGE
INTRAMUSCULAR | Status: DC | PRN
  Administered 2018-01-19: 60 mg via INTRAVENOUS

## 2018-01-19 MED ORDER — FENTANYL CITRATE (PF) 100 MCG/2ML IJ SOLN
INTRAMUSCULAR | Status: DC | PRN
  Administered 2018-01-19: 50 ug via INTRAVENOUS
  Administered 2018-01-19: 25 ug via INTRAVENOUS
  Administered 2018-01-19 (×2): 50 ug via INTRAVENOUS
  Administered 2018-01-19: 100 ug via INTRAVENOUS
  Administered 2018-01-19 (×2): 50 ug via INTRAVENOUS
  Administered 2018-01-19: 100 ug via INTRAVENOUS

## 2018-01-19 MED ORDER — MIDAZOLAM HCL 5 MG/5ML IJ SOLN
INTRAMUSCULAR | Status: DC | PRN
  Administered 2018-01-19: 2 mg via INTRAVENOUS

## 2018-01-19 MED ORDER — IBUPROFEN 600 MG PO TABS
600.0000 mg | ORAL_TABLET | Freq: Four times a day (QID) | ORAL | Status: DC
  Administered 2018-01-20: 600 mg via ORAL
  Filled 2018-01-19: qty 3
  Filled 2018-01-19 (×2): qty 1

## 2018-01-19 MED ORDER — EPHEDRINE 5 MG/ML INJ
INTRAVENOUS | Status: AC
  Filled 2018-01-19: qty 10

## 2018-01-19 MED ORDER — GLYCOPYRROLATE 0.2 MG/ML IV SOSY
PREFILLED_SYRINGE | INTRAVENOUS | Status: AC
Start: 2018-01-19 — End: ?
  Filled 2018-01-19: qty 5

## 2018-01-19 MED ORDER — DEXAMETHASONE SODIUM PHOSPHATE 10 MG/ML IJ SOLN
INTRAMUSCULAR | Status: AC
  Filled 2018-01-19: qty 1

## 2018-01-19 MED ORDER — FENTANYL CITRATE (PF) 100 MCG/2ML IJ SOLN
INTRAMUSCULAR | Status: AC
  Administered 2018-01-19: 50 ug via INTRAVENOUS
  Filled 2018-01-19: qty 2

## 2018-01-19 MED ORDER — FENTANYL CITRATE (PF) 100 MCG/2ML IJ SOLN
INTRAMUSCULAR | Status: AC
  Filled 2018-01-19: qty 2

## 2018-01-19 MED ORDER — LOSARTAN POTASSIUM-HCTZ 50-12.5 MG PO TABS: 1.0000 | ORAL_TABLET | Freq: Every day | ORAL | Status: DC

## 2018-01-19 MED ORDER — PROPOFOL 10 MG/ML IV BOLUS
INTRAVENOUS | Status: AC
  Filled 2018-01-19: qty 20

## 2018-01-19 MED ORDER — OXYCODONE HCL 5 MG PO TABS
5.0000 mg | ORAL_TABLET | ORAL | Status: DC | PRN
  Administered 2018-01-19 – 2018-01-20 (×2): 5 mg via ORAL
  Filled 2018-01-19 (×2): qty 1

## 2018-01-19 MED ORDER — FENTANYL CITRATE (PF) 250 MCG/5ML IJ SOLN
INTRAMUSCULAR | Status: AC
  Filled 2018-01-19: qty 5

## 2018-01-19 MED ORDER — ENOXAPARIN SODIUM 40 MG/0.4ML ~~LOC~~ SOLN
40.0000 mg | SUBCUTANEOUS | Status: AC
  Administered 2018-01-19: 40 mg via SUBCUTANEOUS
  Filled 2018-01-19: qty 0.4

## 2018-01-19 MED ORDER — ACETAMINOPHEN 500 MG PO TABS: 1000.0000 mg | ORAL_TABLET | ORAL | Status: DC

## 2018-01-19 MED ORDER — HYDROCHLOROTHIAZIDE 12.5 MG PO CAPS
12.5000 mg | ORAL_CAPSULE | Freq: Every day | ORAL | Status: DC
  Administered 2018-01-19 – 2018-01-20 (×2): 12.5 mg via ORAL
  Filled 2018-01-19 (×2): qty 1

## 2018-01-19 MED ORDER — FENTANYL CITRATE (PF) 100 MCG/2ML IJ SOLN
25.0000 ug | INTRAMUSCULAR | Status: DC | PRN
  Administered 2018-01-19 (×3): 50 ug via INTRAVENOUS

## 2018-01-19 MED ORDER — ONDANSETRON HCL 4 MG/2ML IJ SOLN
INTRAMUSCULAR | Status: DC | PRN
  Administered 2018-01-19: 4 mg via INTRAVENOUS

## 2018-01-19 MED ORDER — ACETAMINOPHEN 500 MG PO TABS
1000.0000 mg | ORAL_TABLET | ORAL | Status: AC
  Administered 2018-01-19: 1000 mg via ORAL
  Filled 2018-01-19: qty 2

## 2018-01-19 MED ORDER — LIDOCAINE 2% (20 MG/ML) 5 ML SYRINGE
INTRAMUSCULAR | Status: AC
  Filled 2018-01-19: qty 5

## 2018-01-19 MED ORDER — SENNOSIDES-DOCUSATE SODIUM 8.6-50 MG PO TABS
2.0000 | ORAL_TABLET | Freq: Every day | ORAL | Status: DC
  Administered 2018-01-19: 2 via ORAL
  Filled 2018-01-19: qty 2

## 2018-01-19 MED ORDER — HYDROMORPHONE HCL 1 MG/ML IJ SOLN: 0.2000 mg | INTRAMUSCULAR | Status: DC | PRN

## 2018-01-19 MED ORDER — ONDANSETRON HCL 4 MG PO TABS: 4.0000 mg | ORAL_TABLET | Freq: Four times a day (QID) | ORAL | Status: DC | PRN

## 2018-01-19 MED ORDER — SUGAMMADEX SODIUM 200 MG/2ML IV SOLN
INTRAVENOUS | Status: AC
  Filled 2018-01-19: qty 2

## 2018-01-19 MED ORDER — ROCURONIUM BROMIDE 10 MG/ML (PF) SYRINGE
PREFILLED_SYRINGE | INTRAVENOUS | Status: AC
  Filled 2018-01-19: qty 5

## 2018-01-19 MED ORDER — PROPOFOL 10 MG/ML IV BOLUS
INTRAVENOUS | Status: DC | PRN
  Administered 2018-01-19: 140 mg via INTRAVENOUS

## 2018-01-19 MED ORDER — PHENYLEPHRINE 40 MCG/ML (10ML) SYRINGE FOR IV PUSH (FOR BLOOD PRESSURE SUPPORT)
PREFILLED_SYRINGE | INTRAVENOUS | Status: AC
  Filled 2018-01-19: qty 10

## 2018-01-19 MED ORDER — KCL IN DEXTROSE-NACL 20-5-0.45 MEQ/L-%-% IV SOLN
INTRAVENOUS | Status: DC
  Administered 2018-01-19: 19:00:00 via INTRAVENOUS
  Filled 2018-01-19 (×2): qty 1000

## 2018-01-19 MED ORDER — LOSARTAN POTASSIUM 50 MG PO TABS
50.0000 mg | ORAL_TABLET | Freq: Every day | ORAL | Status: DC
Start: 2018-01-19 — End: 2018-01-20
  Administered 2018-01-19 – 2018-01-20 (×2): 50 mg via ORAL
  Filled 2018-01-19 (×2): qty 1

## 2018-01-19 MED ORDER — ROCURONIUM BROMIDE 10 MG/ML (PF) SYRINGE
PREFILLED_SYRINGE | INTRAVENOUS | Status: DC | PRN
Start: 2018-01-19 — End: 2018-01-19
  Administered 2018-01-19: 60 mg via INTRAVENOUS

## 2018-01-19 MED ORDER — LACTATED RINGERS IV SOLN
INTRAVENOUS | Status: DC
Start: 2018-01-19 — End: 2018-01-19
  Administered 2018-01-19 (×2): via INTRAVENOUS

## 2018-01-19 MED ORDER — ONDANSETRON HCL 4 MG/2ML IJ SOLN
INTRAMUSCULAR | Status: AC
  Administered 2018-01-19: 4 mg via INTRAVENOUS
  Filled 2018-01-19: qty 2

## 2018-01-19 MED ORDER — HYDROMORPHONE HCL 1 MG/ML IJ SOLN: 0.2500 mg | INTRAMUSCULAR | Status: DC | PRN

## 2018-01-19 MED ORDER — STERILE WATER FOR INJECTION IJ SOLN
INTRAMUSCULAR | Status: AC
  Filled 2018-01-19: qty 10

## 2018-01-19 MED ORDER — DEXAMETHASONE SODIUM PHOSPHATE 4 MG/ML IJ SOLN
4.0000 mg | INTRAMUSCULAR | Status: AC
  Administered 2018-01-19: 10 mg via INTRAVENOUS

## 2018-01-19 MED ORDER — GLYCOPYRROLATE 0.2 MG/ML IV SOSY
PREFILLED_SYRINGE | INTRAVENOUS | Status: AC
  Filled 2018-01-19: qty 5

## 2018-01-19 MED ORDER — MIDAZOLAM HCL 2 MG/2ML IJ SOLN
INTRAMUSCULAR | Status: AC
  Filled 2018-01-19: qty 2

## 2018-01-19 MED ORDER — ENOXAPARIN SODIUM 40 MG/0.4ML ~~LOC~~ SOLN
40.0000 mg | SUBCUTANEOUS | Status: DC
  Administered 2018-01-20: 40 mg via SUBCUTANEOUS
  Filled 2018-01-19: qty 0.4

## 2018-01-19 MED ORDER — GABAPENTIN 300 MG PO CAPS
600.0000 mg | ORAL_CAPSULE | Freq: Every day | ORAL | Status: AC
  Administered 2018-01-19: 600 mg via ORAL
  Filled 2018-01-19: qty 2

## 2018-01-19 MED ORDER — GABAPENTIN 300 MG PO CAPS
300.0000 mg | ORAL_CAPSULE | ORAL | Status: AC
  Administered 2018-01-19: 300 mg via ORAL
  Filled 2018-01-19: qty 1

## 2018-01-19 MED ORDER — LACTATED RINGERS IV SOLN
INTRAVENOUS | Status: DC | PRN
  Administered 2018-01-19: 1000 mL via INTRAVENOUS

## 2018-01-19 MED ORDER — ONDANSETRON HCL 4 MG/2ML IJ SOLN: 4.0000 mg | Freq: Four times a day (QID) | INTRAMUSCULAR | Status: DC | PRN

## 2018-01-19 MED ORDER — SCOPOLAMINE 1 MG/3DAYS TD PT72
1.0000 | MEDICATED_PATCH | TRANSDERMAL | Status: DC
  Administered 2018-01-19: 1.5 mg via TRANSDERMAL
  Filled 2018-01-19: qty 1

## 2018-01-19 MED ORDER — ONDANSETRON HCL 4 MG/2ML IJ SOLN
INTRAMUSCULAR | Status: AC
  Filled 2018-01-19: qty 2

## 2018-01-19 MED ORDER — ONDANSETRON HCL 4 MG/2ML IJ SOLN
4.0000 mg | Freq: Once | INTRAMUSCULAR | Status: AC | PRN
  Administered 2018-01-19: 4 mg via INTRAVENOUS

## 2018-01-19 MED ORDER — EPHEDRINE SULFATE-NACL 50-0.9 MG/10ML-% IV SOSY
PREFILLED_SYRINGE | INTRAVENOUS | Status: DC | PRN
  Administered 2018-01-19 (×2): 5 mg via INTRAVENOUS

## 2018-01-19 SURGICAL SUPPLY — 60 items
APPLICATOR SURGIFLO ENDO (HEMOSTASIS) ×3 IMPLANT
BAG LAPAROSCOPIC 12 15 PORT 16 (BASKET) IMPLANT
BAG RETRIEVAL 12/15 (BASKET)
CHLORAPREP W/TINT 26ML (MISCELLANEOUS) ×3 IMPLANT
COVER SURGICAL LIGHT HANDLE (MISCELLANEOUS) ×3 IMPLANT
COVER TIP SHEARS 8 DVNC (MISCELLANEOUS) ×2 IMPLANT
COVER TIP SHEARS 8MM DA VINCI (MISCELLANEOUS) ×1
DERMABOND ADVANCED (GAUZE/BANDAGES/DRESSINGS) ×1
DERMABOND ADVANCED .7 DNX12 (GAUZE/BANDAGES/DRESSINGS) ×2 IMPLANT
DRAPE ARM DVNC X/XI (DISPOSABLE) ×8 IMPLANT
DRAPE COLUMN DVNC XI (DISPOSABLE) ×2 IMPLANT
DRAPE DA VINCI XI ARM (DISPOSABLE) ×4
DRAPE DA VINCI XI COLUMN (DISPOSABLE) ×1
DRAPE SHEET LG 3/4 BI-LAMINATE (DRAPES) ×6 IMPLANT
DRAPE SURG IRRIG POUCH 19X23 (DRAPES) ×3 IMPLANT
ELECT REM PT RETURN 15FT ADLT (MISCELLANEOUS) ×3 IMPLANT
FLOSEAL 10ML (HEMOSTASIS) ×3 IMPLANT
GLOVE BIO SURGEON STRL SZ 6 (GLOVE) ×12 IMPLANT
GLOVE BIO SURGEON STRL SZ 6.5 (GLOVE) ×6 IMPLANT
GOWN STRL REUS W/ TWL LRG LVL3 (GOWN DISPOSABLE) ×6 IMPLANT
GOWN STRL REUS W/TWL LRG LVL3 (GOWN DISPOSABLE) ×3
HOLDER FOLEY CATH W/STRAP (MISCELLANEOUS) ×3 IMPLANT
IRRIG SUCT STRYKERFLOW 2 WTIP (MISCELLANEOUS) ×3
IRRIGATION SUCT STRKRFLW 2 WTP (MISCELLANEOUS) ×2 IMPLANT
KIT BASIN OR (CUSTOM PROCEDURE TRAY) ×3 IMPLANT
KIT PROCEDURE DA VINCI SI (MISCELLANEOUS)
KIT PROCEDURE DVNC SI (MISCELLANEOUS) IMPLANT
MANIPULATOR UTERINE 4.5 ZUMI (MISCELLANEOUS) ×3 IMPLANT
MARKER SKIN DUAL TIP RULER LAB (MISCELLANEOUS) ×3 IMPLANT
NDL SAFETY ECLIPSE 18X1.5 (NEEDLE) IMPLANT
NEEDLE HYPO 18GX1.5 SHARP (NEEDLE)
NEEDLE SPNL 18GX3.5 QUINCKE PK (NEEDLE) IMPLANT
OBTURATOR OPTICAL STANDARD 8MM (TROCAR) ×1
OBTURATOR OPTICAL STND 8 DVNC (TROCAR) ×2
OBTURATOR OPTICALSTD 8 DVNC (TROCAR) ×2 IMPLANT
OCCLUDER COLPOPNEUMO (BALLOONS) ×3 IMPLANT
PAD POSITIONING PINK XL (MISCELLANEOUS) ×3 IMPLANT
PORT ACCESS TROCAR AIRSEAL 12 (TROCAR) ×2 IMPLANT
PORT ACCESS TROCAR AIRSEAL 5M (TROCAR) ×1
POUCH SPECIMEN RETRIEVAL 10MM (ENDOMECHANICALS) IMPLANT
SEAL CANN UNIV 5-8 DVNC XI (MISCELLANEOUS) ×8 IMPLANT
SEAL XI 5MM-8MM UNIVERSAL (MISCELLANEOUS) ×4
SET TRI-LUMEN FLTR TB AIRSEAL (TUBING) ×3 IMPLANT
SHEET LAVH (DRAPES) ×3 IMPLANT
SOLUTION ELECTROLUBE (MISCELLANEOUS) ×3 IMPLANT
SUT MNCRL AB 4-0 PS2 18 (SUTURE) ×6 IMPLANT
SUT VIC AB 0 CT1 27 (SUTURE) ×1
SUT VIC AB 0 CT1 27XBRD ANTBC (SUTURE) ×2 IMPLANT
SUT VIC AB 3-0 SH 27 (SUTURE) ×1
SUT VIC AB 3-0 SH 27X BRD (SUTURE) ×2 IMPLANT
SYR 10ML LL (SYRINGE) IMPLANT
SYR 50ML LL SCALE MARK (SYRINGE) ×3 IMPLANT
TOWEL OR 17X26 10 PK STRL BLUE (TOWEL DISPOSABLE) ×6 IMPLANT
TOWEL OR NON WOVEN STRL DISP B (DISPOSABLE) ×3 IMPLANT
TRAP SPECIMEN MUCOUS 40CC (MISCELLANEOUS) IMPLANT
TRAY FOLEY W/METER SILVER 16FR (SET/KITS/TRAYS/PACK) ×3 IMPLANT
TRAY LAPAROSCOPIC (CUSTOM PROCEDURE TRAY) ×3 IMPLANT
TROCAR BLADELESS OPT 5 100 (ENDOMECHANICALS) ×3 IMPLANT
UNDERPAD 30X30 (UNDERPADS AND DIAPERS) ×3 IMPLANT
WATER STERILE IRR 1000ML POUR (IV SOLUTION) ×3 IMPLANT

## 2018-01-19 NOTE — Anesthesia Procedure Notes (Signed)
Procedure Name: Intubation Date/Time: 01/19/2018 2:36 PM Performed by: Claudia Desanctis, CRNA Pre-anesthesia Checklist: Patient identified, Emergency Drugs available, Suction available and Patient being monitored Patient Re-evaluated:Patient Re-evaluated prior to induction Oxygen Delivery Method: Circle system utilized Preoxygenation: Pre-oxygenation with 100% oxygen Induction Type: IV induction Ventilation: Mask ventilation without difficulty Laryngoscope Size: 2 and Miller Grade View: Grade I Tube type: Oral Tube size: 7.5 mm Number of attempts: 1 Airway Equipment and Method: Stylet Placement Confirmation: ETT inserted through vocal cords under direct vision,  positive ETCO2 and breath sounds checked- equal and bilateral Secured at: 23 cm Tube secured with: Tape Dental Injury: Teeth and Oropharynx as per pre-operative assessment
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
## 2018-01-19 NOTE — Anesthesia Preprocedure Evaluation (Signed)
Anesthesia Evaluation  Patient identified by MRN, date of birth, ID band Patient awake    Reviewed: Allergy & Precautions, NPO status , Patient's Chart, lab work & pertinent test results  Airway Mallampati: II  TM Distance: >3 FB Neck ROM: Full    Dental   Pulmonary    Pulmonary exam normal        Cardiovascular hypertension, Pt. on medications Normal cardiovascular exam     Neuro/Psych    GI/Hepatic   Endo/Other    Renal/GU      Musculoskeletal   Abdominal   Peds  Hematology   Anesthesia Other Findings   Reproductive/Obstetrics                             Anesthesia Physical Anesthesia Plan  ASA: III  Anesthesia Plan: General   Post-op Pain Management:    Induction: Intravenous  PONV Risk Score and Plan: 3 and Ondansetron, Treatment may vary due to age or medical condition and Midazolam  Airway Management Planned: Oral ETT  Additional Equipment:   Intra-op Plan:   Post-operative Plan: Extubation in OR  Informed Consent: I have reviewed the patients History and Physical, chart, labs and discussed the procedure including the risks, benefits and alternatives for the proposed anesthesia with the patient or authorized representative who has indicated his/her understanding and acceptance.     Plan Discussed with: CRNA and Surgeon  Anesthesia Plan Comments:         Anesthesia Quick Evaluation

## 2018-01-19 NOTE — Transfer of Care (Signed)
Immediate Anesthesia Transfer of Care Note  Patient: Alyssa Cherry  Procedure(s) Performed: XI ROBOTIC ASSISTED  HYSTERECTOMY WITH BILATERAL SALPINGO OOPHORECTOMY WITH SENTINAL LYMPH NODE, REMOVAL OF SKIN TAG ON LEFT THIGH (Bilateral ) EXCISION OF SKIN TAG LEFT THIGH (Left Thigh)  Patient Location: PACU  Anesthesia Type:General  Level of Consciousness: awake, alert , oriented and patient cooperative  Airway & Oxygen Therapy: Patient Spontanous Breathing and Patient connected to face mask  Post-op Assessment: Report given to RN and Post -op Vital signs reviewed and stable  Post vital signs: Reviewed and stable  Last Vitals:  Vitals Value Taken Time  BP 151/67 01/19/2018  4:45 PM  Temp    Pulse 84 01/19/2018  4:47 PM  Resp 15 01/19/2018  4:47 PM  SpO2 100 % 01/19/2018  4:47 PM  Vitals shown include unvalidated device data.  Last Pain:  Vitals:   01/19/18 1206  TempSrc: Oral      Patients Stated Pain Goal: 5 (20/23/34 3568)  Complications: No apparent anesthesia complications

## 2018-01-19 NOTE — Anesthesia Postprocedure Evaluation (Signed)
Anesthesia Post Note  Patient: Guyla Salaz  Procedure(s) Performed: XI ROBOTIC ASSISTED  HYSTERECTOMY WITH BILATERAL SALPINGO OOPHORECTOMY WITH SENTINAL LYMPH NODE, REMOVAL OF SKIN TAG ON LEFT THIGH (Bilateral ) EXCISION OF SKIN TAG LEFT THIGH (Left Thigh)     Patient location during evaluation: PACU Anesthesia Type: General Level of consciousness: awake and alert Pain management: pain level controlled Vital Signs Assessment: post-procedure vital signs reviewed and stable Respiratory status: spontaneous breathing, nonlabored ventilation, respiratory function stable and patient connected to nasal cannula oxygen Cardiovascular status: blood pressure returned to baseline and stable Postop Assessment: no apparent nausea or vomiting Anesthetic complications: no    Last Vitals:  Vitals:   01/19/18 1700 01/19/18 1715  BP: (!) 146/70 (!) 142/74  Pulse: 64 85  Resp: 12 20  Temp:    SpO2: 100% 100%    Last Pain:  Vitals:   01/19/18 1715  TempSrc:   PainSc: 7                  Maudean Hoffmann DAVID

## 2018-01-19 NOTE — Op Note (Signed)
OPERATIVE NOTE 01/19/18  Surgeon: Alyssa Cherry   Assistants: Dr Lahoma Crocker (an MD assistant was necessary for tissue manipulation, management of robotic instrumentation, retraction and positioning due to the complexity of the case and hospital policies).   Anesthesia: General endotracheal anesthesia  ASA Class: 3   Pre-operative Diagnosis: complex atypical hyperplasia  Post-operative Diagnosis: same,   Operation: Robotic-assisted laparoscopic total hysterectomy with bilateral salpingoophorectomy, SLN biopsy; removal of left thigh skin tag.   Surgeon: Alyssa Cherry  Assistant Surgeon: Lahoma Crocker MD  Anesthesia: GET  Urine Output: 300cc  Operative Findings:  : 8cm slightly bulky uterus. Normal tubes and ovaries. Normal appendix, and large intestine. BMI of 50 with extreme intraperitoneal and retroperitoneal adiposity making visualization more limited.  35mm skin tag medial left thigh.   Estimated Blood Loss:  less than 100 mL      Total IV Fluids: 1,000 ml         Specimens: left thigh skin tag, uterus, cervix, bilateral tubes and ovaries, right external iliac SLN, right obturator SLN, right common iliac SLN, left obturator SLN         Complications:  None; patient tolerated the procedure well.         Disposition: PACU - hemodynamically stable.  Procedure Details  The patient was seen in the Holding Room. The risks, benefits, complications, treatment options, and expected outcomes were discussed with the patient.  The patient concurred with the proposed plan, giving informed consent.  The site of surgery properly noted/marked. The patient was identified as Alyssa Cherry and the procedure verified as a Robotic-assisted hysterectomy with bilateral salpingo oophorectomy with SLN biopsy. A Time Out was held and the above information confirmed.  After induction of anesthesia, the patient was draped and prepped in the usual sterile manner. Pt was placed in  supine position after anesthesia and draped and prepped in the usual sterile manner. The abdominal drape was placed after the CholoraPrep had been allowed to dry for 3 minutes.  Her arms were tucked to her side with all appropriate precautions.  The shoulders were stabilized with padded shoulder blocks applied to the acromium processes.  The patient was placed in the semi-lithotomy position in [place].  The perineum was prepped with Betadine. The patient was then prepped. Foley catheter was placed.  A sterile speculum was placed in the vagina.  The cervix was grasped with a single-tooth tenaculum. 2mg  total of ICG was injected into the cervical stroma at 2 and 9 o'clock with 1cc injected at a 1cm and 84mm depth (concentration 0.5mg /ml) in all locations. The cervix was dilated with Kennon Rounds dilators.  The ZUMI uterine manipulator with a medium colpotomizer ring was placed without difficulty.  A pneum occluder balloon was placed over the manipulator.  OG tube placement was confirmed and to suction.   Next, a 5 mm skin incision was made 1 cm below the subcostal margin in the midclavicular line.  The 5 mm Optiview port and scope was used for direct entry.  Opening pressure was under 10 mm CO2.  The abdomen was insufflated and the findings were noted as above.   At this point and all points during the procedure, the patient's intra-abdominal pressure did not exceed 15 mmHg. Next, a 10 mm skin incision was made in the umbilicus and a right and left port was placed about 10 cm lateral to the robot port on the right and left side.  A fourth arm was placed in the left  lower quadrant 2 cm above and superior and medial to the anterior superior iliac spine.  All ports were placed under direct visualization.  The patient was placed in steep Trendelenburg.  Bowel was folded away into the upper abdomen.  The robot was docked in the normal manner.  The right and left peritoneum were opened parallel to the IP ligament to open  the retroperitoneal spaces bilaterally. The SLN mapping was performed in bilateral pelvic basins. The para rectal and paravesical spaces were opened up entirely with careful dissection below the level of the ureters bilaterally and to the depth of the uterine artery origin in order to skeletonize the uterine "web" and ensure visualization of all parametrial channels. The para-aortic basins were carefully exposed and evaluated for isolated para-aortic SLN's. Lymphatic channels were identified travelling to the following visualized sentinel lymph node's: right obturator, external iliac and common iliac. On the left there was mapping to the left obturator SLN. After removing there was some residual minimal bleeding/oozing from this site which was made hemostatic with floseal. These SLN's were separated from their surrounding lymphatic tissue, removed and sent for permanent pathology.  The hysterectomy was started after the round ligament on the right side was incised and the retroperitoneum was entered and the pararectal space was developed.  The ureter was noted to be on the medial leaf of the broad ligament.  The peritoneum above the ureter was incised and stretched and the infundibulopelvic ligament was skeletonized, cauterized and cut.  The posterior peritoneum was taken down to the level of the KOH ring.  The anterior peritoneum was also taken down.  The bladder flap was created to the level of the KOH ring.  The uterine artery on the right side was skeletonized, cauterized and cut in the normal manner.  A similar procedure was performed on the left.  The colpotomy was made and the uterus, cervix, bilateral ovaries and tubes were amputated and delivered through the vagina.  Pedicles were inspected and excellent hemostasis was achieved.    The colpotomy at the vaginal cuff was closed with Vicryl on a CT1 needle in a running manner.  Irrigation was used and excellent hemostasis was achieved.  At this point in  the procedure was completed.  Robotic instruments were removed under direct visulaization.  The robot was undocked. The 10 mm ports were closed with Vicryl on a UR-5 needle and the fascia was closed with 0 Vicryl on a UR-5 needle.  The skin was closed with 4-0 Vicryl in a subcuticular manner.  Dermabond was applied.  Sponge, lap and needle counts correct x 2.  The patient was taken to the recovery room in stable condition.  The vagina was swabbed with  minimal bleeding noted.   All instrument and needle counts were correct x  3.   The patient was transferred to the recovery room in a stable condition.  Alyssa Eva, MD

## 2018-01-20 ENCOUNTER — Encounter (HOSPITAL_COMMUNITY): Payer: Self-pay | Admitting: Gynecologic Oncology

## 2018-01-20 DIAGNOSIS — N72 Inflammatory disease of cervix uteri: Secondary | ICD-10-CM | POA: Diagnosis not present

## 2018-01-20 LAB — BASIC METABOLIC PANEL
Anion gap: 9 (ref 5–15)
BUN: 13 mg/dL (ref 6–20)
CO2: 22 mmol/L (ref 22–32)
CREATININE: 0.93 mg/dL (ref 0.44–1.00)
Calcium: 8.7 mg/dL — ABNORMAL LOW (ref 8.9–10.3)
Chloride: 107 mmol/L (ref 101–111)
GFR calc Af Amer: >60 mL/min (ref >60)
GLUCOSE: 147 mg/dL — AB (ref 65–99)
Potassium: 4.2 mmol/L (ref 3.5–5.1)
SODIUM: 138 mmol/L (ref 135–145)

## 2018-01-20 LAB — CBC
HCT: 36.7 % (ref 36.0–46.0)
Hemoglobin: 11.5 g/dL — ABNORMAL LOW (ref 12.0–15.0)
MCH: 25.1 pg — AB (ref 26.0–34.0)
MCHC: 31.3 g/dL (ref 30.0–36.0)
MCV: 80 fL (ref 78.0–100.0)
PLATELETS: 321 10*3/uL (ref 150–400)
RBC: 4.59 MIL/uL (ref 3.87–5.11)
RDW: 15.2 % (ref 11.5–15.5)
WBC: 10 10*3/uL (ref 4.0–10.5)

## 2018-01-20 MED ORDER — SENNOSIDES-DOCUSATE SODIUM 8.6-50 MG PO TABS: 2.0000 | ORAL_TABLET | Freq: Every evening | ORAL | 0 refills | Status: DC | PRN

## 2018-01-20 MED ORDER — IBUPROFEN 600 MG PO TABS: 600.0000 mg | ORAL_TABLET | Freq: Three times a day (TID) | ORAL | 0 refills | Status: AC | PRN

## 2018-01-20 MED ORDER — OXYCODONE HCL 5 MG PO TABS: 5.0000 mg | ORAL_TABLET | ORAL | 0 refills | Status: DC | PRN

## 2018-01-20 NOTE — Discharge Summary (Signed)
Physician Discharge Summary  Patient ID: Alyssa Cherry MRN: [id number] DOB/AGE: 01-08-1966 52 y.o.  Admit date: 01/19/2018 Discharge date: 01/20/2018  Admission Diagnoses: Endometrial hyperplasia with atypia  Discharge Diagnoses:  Principal Problem:   Endometrial hyperplasia with atypia Active Problems:   Cutaneous skin tags   Morbid obesity ([place])   Complex endometrial hyperplasia with atypia   Discharged Condition:  The patient is in good condition and stable for discharge.   Hospital Course: On 01/19/2018, the patient underwent the following: Procedure(s): XI ROBOTIC ASSISTED  HYSTERECTOMY WITH BILATERAL SALPINGO OOPHORECTOMY WITH SENTINAL LYMPH NODE, REMOVAL OF SKIN TAG ON LEFT THIGH EXCISION OF SKIN TAG LEFT THIGH.  The postoperative course was uneventful.  She was discharged to home on postoperative day 1 tolerating a regular diet, ambulating, pain controlled, voiding.  Consults: None  Significant Diagnostic Studies: None  Treatments: surgery: see above  Discharge Exam: Blood pressure 130/61, pulse 77, temperature 98.7 F (37.1 C), temperature source Oral, resp. rate 16, height 5\' 4"  (1.626 m), weight 279 lb (126.6 kg), SpO2 98 %. General appearance: alert, cooperative and no distress Resp: clear to auscultation bilaterally Cardio: regular rate and rhythm, S1, S2 normal, no murmur, click, rub or gallop GI: soft, non-tender; bowel sounds normal; no masses,  no organomegaly and abdomen morbidly obese Extremities: extremities normal, atraumatic, no cyanosis or edema Incision/Wound: Lap sites to the abdomen with dermabond without erythema or drainage  Disposition: Discharge disposition: 01-Home or Self Care       Discharge Instructions    Call MD for:  difficulty breathing, headache or visual disturbances   Complete by:  As directed    Call MD for:  extreme fatigue   Complete by:  As directed    Call MD for:  hives   Complete by:  As directed    Call MD for:  persistant  dizziness or light-headedness   Complete by:  As directed    Call MD for:  persistant nausea and vomiting   Complete by:  As directed    Call MD for:  redness, tenderness, or signs of infection (pain, swelling, redness, odor or green/yellow discharge around incision site)   Complete by:  As directed    Call MD for:  severe uncontrolled pain   Complete by:  As directed    Call MD for:  temperature >100.4   Complete by:  As directed    Diet - low sodium heart healthy   Complete by:  As directed    Driving Restrictions   Complete by:  As directed    No driving for 1 week.  Do not take narcotics and drive.   Increase activity slowly   Complete by:  As directed    Lifting restrictions   Complete by:  As directed    No lifting greater than 10 lbs.   Sexual Activity Restrictions   Complete by:  As directed    No sexual activity, nothing in the vagina, for 8 weeks.     Allergies as of 01/20/2018      Reactions   Dilaudid [hydromorphone] Other (See Comments)   Made pt feel shaky and nervous   Sulfa Antibiotics Rash      Medication List    STOP taking these medications   megestrol 40 MG tablet Commonly known as:  MEGACE     TAKE these medications   BENADRYL ALLERGY PO Take by mouth as needed (takes 1 liquid gel capsule PRN at bedtime).   bisacodyl 5 MG EC  tablet Commonly known as:  DULCOLAX Take 5 mg by mouth daily.   ibuprofen 600 MG tablet Commonly known as:  ADVIL,MOTRIN Take 1 tablet (600 mg total) by mouth every 8 (eight) hours as needed for moderate pain.   losartan-hydrochlorothiazide 50-12.5 MG tablet Commonly known as:  HYZAAR Take 1 tablet by mouth daily.   oxyCODONE 5 MG immediate release tablet Commonly known as:  Oxy IR/ROXICODONE Take 1 tablet (5 mg total) by mouth every 4 (four) hours as needed for severe pain.   phentermine 37.5 MG capsule Take 37.5 mg by mouth every other day.   polyethylene glycol packet Commonly known as:  MIRALAX / GLYCOLAX Take  17 g by mouth daily as needed.   senna-docusate 8.6-50 MG tablet Commonly known as:  Senokot-S Take 2 tablets by mouth at bedtime as needed for mild constipation.        Greater than thirty minutes were spend for face to face discharge instructions and discharge orders/summary in EPIC.   Signed: Dorothyann Gibbs 01/20/2018, 11:09 AM

## 2018-01-20 NOTE — Discharge Instructions (Signed)
01/20/2018  Return to work: 4-6 weeks if applicable  Activity: 1. Be up and out of the bed during the day.  Take a nap if needed.  You may walk up steps but be careful and use the hand rail.  Stair climbing will tire you more than you think, you may need to stop part way and rest.   2. No lifting or straining for 6 weeks.  3. No driving for 1 week(s).  Do not drive if you are taking narcotic pain medicine.  4. Shower daily.  Use soap and water on your incision and pat dry; don't rub.  No tub baths until cleared by your surgeon.   5. No sexual activity and nothing in the vagina for 8 weeks.  6. You may experience a small amount of clear drainage from your incisions, which is normal.  If the drainage persists or increases, please call the office.  7. You may experience vaginal spotting after surgery or around the 6-8 week mark from surgery when the stitches at the top of the vagina begin to dissolve.  The spotting is normal but if you experience heavy bleeding, call our office.  8. Take Tylenol or ibuprofen first for pain and only use oxycodone for severe pain not relieved by the Tylenol or Ibuprofen.  Monitor your Tylenol intake to a max of 4,000 mg a day.  Diet: 1. Low sodium Heart Healthy Diet is recommended.  2. It is safe to use a laxative, such as Miralax or Colace, if you have difficulty moving your bowels. You can take Sennakot at bedtime every evening to keep bowel movements regular and to prevent constipation.    Wound Care: 1. Keep clean and dry.  Shower daily.  Reasons to call the Doctor:  Fever - Oral temperature greater than 100.4 degrees Fahrenheit  Foul-smelling vaginal discharge  Difficulty urinating  Nausea and vomiting  Increased pain at the site of the incision that is unrelieved with pain medicine.  Difficulty breathing with or without chest pain  New calf pain especially if only on one side  Sudden, continuing increased vaginal bleeding with or without  clots.   Contacts: For questions or concerns you should contact:  Dr. Everitt Amber at [phone]  Joylene John, NP at [phone]  After Hours: call [phone] and have the GYN Oncologist paged/contacted  Oxycodone tablets or capsules What is this medicine? OXYCODONE (ox i KOE done) is a pain reliever. It is used to treat moderate to severe pain. This medicine may be used for other purposes; ask your health care provider or pharmacist if you have questions. COMMON BRAND NAME(S): Dazidox, Endocodone, Oxaydo, OXECTA, OxyIR, Percolone, Roxicodone, ROXYBOND What should I tell my health care provider before I take this medicine? They need to know if you have any of these conditions: -Addison's disease -brain tumor -head injury -heart disease -history of drug or alcohol abuse problem -if you often drink alcohol -kidney disease -liver disease -lung or breathing disease, like asthma -mental illness -pancreatic disease -seizures -thyroid disease -an unusual or allergic reaction to oxycodone, codeine, hydrocodone, morphine, other medicines, foods, dyes, or preservatives -pregnant or trying to get pregnant -breast-feeding How should I use this medicine? Take this medicine by mouth with a glass of water. Follow the directions on the prescription label. You can take it with or without food. If it upsets your stomach, take it with food. Take your medicine at regular intervals. Do not take it more often than directed. Do not stop taking  except on your doctor's advice. Some brands of this medicine, like Oxecta, have special instructions. Ask your doctor or pharmacist if these directions are for you: Do not cut, crush or chew this medicine. Swallow only one tablet at a time. Do not wet, soak, or lick the tablet before you take it. A special MedGuide will be given to you by the pharmacist with each prescription and refill. Be sure to read this information carefully each time. Talk to your  pediatrician regarding the use of this medicine in children. Special care may be needed. Overdosage: If you think you have taken too much of this medicine contact a poison control center or emergency room at once. NOTE: This medicine is only for you. Do not share this medicine with others. What if I miss a dose? If you miss a dose, take it as soon as you can. If it is almost time for your next dose, take only that dose. Do not take double or extra doses. What may interact with this medicine? This medicine may interact with the following medications: -alcohol -antihistamines for allergy, cough and cold -antiviral medicines for HIV or AIDS -atropine -certain antibiotics like clarithromycin, erythromycin, linezolid, rifampin -certain medicines for anxiety or sleep -certain medicines for bladder problems like oxybutynin, tolterodine -certain medicines for depression like amitriptyline, fluoxetine, sertraline -certain medicines for fungal infections like ketoconazole, itraconazole, voriconazole -certain medicines for migraine headache like almotriptan, eletriptan, frovatriptan, naratriptan, rizatriptan, sumatriptan, zolmitriptan -certain medicines for nausea or vomiting like dolasetron, ondansetron, palonosetron -certain medicines for Parkinson's disease like benztropine, trihexyphenidyl -certain medicines for seizures like phenobarbital, phenytoin, primidone -certain medicines for stomach problems like dicyclomine, hyoscyamine -certain medicines for travel sickness like scopolamine -diuretics -general anesthetics like halothane, isoflurane, methoxyflurane, propofol -ipratropium -local anesthetics like lidocaine, pramoxine, tetracaine -MAOIs like Carbex, Eldepryl, Marplan, Nardil, and Parnate -medicines that relax muscles for surgery -methylene blue -nilotinib -other narcotic medicines for pain or cough -phenothiazines like chlorpromazine, mesoridazine, prochlorperazine, thioridazine This  list may not describe all possible interactions. Give your health care provider a list of all the medicines, herbs, non-prescription drugs, or dietary supplements you use. Also tell them if you smoke, drink alcohol, or use illegal drugs. Some items may interact with your medicine. What should I watch for while using this medicine? Tell your doctor or health care professional if your pain does not go away, if it gets worse, or if you have new or a different type of pain. You may develop tolerance to the medicine. Tolerance means that you will need a higher dose of the medicine for pain relief. Tolerance is normal and is expected if you take this medicine for a long time. Do not suddenly stop taking your medicine because you may develop a severe reaction. Your body becomes used to the medicine. This does NOT mean you are addicted. Addiction is a behavior related to getting and using a drug for a non-medical reason. If you have pain, you have a medical reason to take pain medicine. Your doctor will tell you how much medicine to take. If your doctor wants you to stop the medicine, the dose will be slowly lowered over time to avoid any side effects. There are different types of narcotic medicines (opiates). If you take more than one type at the same time or if you are taking another medicine that also causes drowsiness, you may have more side effects. Give your health care provider a list of all medicines you use. Your doctor will tell you how much  medicine to take. Do not take more medicine than directed. Call emergency for help if you have problems breathing or unusual sleepiness. You may get drowsy or dizzy. Do not drive, use machinery, or do anything that needs mental alertness until you know how the medicine affects you. Do not stand or sit up quickly, especially if you are an older patient. This reduces the risk of dizzy or fainting spells. Alcohol may interfere with the effect of this medicine. Avoid  alcoholic drinks. This medicine will cause constipation. Try to have a bowel movement at least every 2 to 3 days. If you do not have a bowel movement for 3 days, call your doctor or health care professional. Your mouth may get dry. Chewing sugarless gum or sucking hard candy, and drinking plenty of water may help. Contact your doctor if the problem does not go away or is severe. What side effects may I notice from receiving this medicine? Side effects that you should report to your doctor or health care professional as soon as possible: -allergic reactions like skin rash, itching or hives, swelling of the face, lips, or tongue -breathing problems -confusion -signs and symptoms of low blood pressure like dizziness; feeling faint or lightheaded, falls; unusually weak or tired -trouble passing urine or change in the amount of urine -trouble swallowing Side effects that usually do not require medical attention (report to your doctor or health care professional if they continue or are bothersome): -constipation -dry mouth -nausea, vomiting -tiredness This list may not describe all possible side effects. Call your doctor for medical advice about side effects. You may report side effects to FDA at 1-800-FDA-1088. Where should I keep my medicine? Keep out of the reach of children. This medicine can be abused. Keep your medicine in a safe place to protect it from theft. Do not share this medicine with anyone. Selling or giving away this medicine is dangerous and against the law. Store at room temperature between 15 and 30 degrees C (59 and 86 degrees F). Protect from light. Keep container tightly closed. This medicine may cause accidental overdose and death if it is taken by other adults, children, or pets. Flush any unused medicine down the toilet to reduce the chance of harm. Do not use the medicine after the expiration date. NOTE: This sheet is a summary. It may not cover all possible information. If  you have questions about this medicine, talk to your doctor, pharmacist, or health care provider.  2018 Elsevier/Gold Standard (2015-07-24 16:55:57)

## 2018-01-20 NOTE — Progress Notes (Signed)
Discharge and medication instructions reviewed with patient. Questions answered and patient denies further questions. Spouse is here to drive patient home.  Donne Hazel, RN

## 2018-01-22 ENCOUNTER — Encounter: Payer: Self-pay | Admitting: Gynecologic Oncology

## 2018-01-22 ENCOUNTER — Telehealth: Payer: Self-pay

## 2018-01-22 ENCOUNTER — Other Ambulatory Visit: Payer: Self-pay | Admitting: Gynecologic Oncology

## 2018-01-22 DIAGNOSIS — N8502 Endometrial intraepithelial neoplasia [EIN]: Secondary | ICD-10-CM

## 2018-01-22 MED ORDER — OXYCODONE HCL 5 MG PO TABS: 5.0000 mg | ORAL_TABLET | Freq: Four times a day (QID) | ORAL | 0 refills | Status: AC | PRN

## 2018-01-22 NOTE — Telephone Encounter (Signed)
LM for patient to call the office to discuss report and refill request.

## 2018-01-22 NOTE — Telephone Encounter (Signed)
Told Alyssa Cherry that the surgical pathology on the skin tag was benign. The lymph nodes were clear. Hyperplasia seen but no cancer.  Pt. Prescribed 10 mor Oxyir tabs as she spilled the bottle on the ground and only has 3 tabs left. Alyssa Cherry to E prescribe prescription. Pt has not moved bowels since surgery.  She has be taking a capful of Miralax 4 times a day. Told her to take 1/2 bottle of mag citrate and then if no good evacuation to drink the other half of the bottle. Continue stool softeners and Miralax daily. Pt verbalized.

## 2018-01-22 NOTE — Progress Notes (Signed)
See RN note.  Patient sent mychart message stating she dropped her pills outside on the ground and only has three tablets left.  Situation discussed with Dr. [name].  Refill for 10 tablets only prescribed.

## 2018-01-27 ENCOUNTER — Encounter: Payer: Self-pay | Admitting: Gynecologic Oncology

## 2018-01-28 ENCOUNTER — Other Ambulatory Visit: Payer: Self-pay | Admitting: Gynecologic Oncology

## 2018-01-28 ENCOUNTER — Encounter: Payer: Self-pay | Admitting: Gynecologic Oncology

## 2018-01-28 DIAGNOSIS — N951 Menopausal and female climacteric states: Secondary | ICD-10-CM

## 2018-01-28 DIAGNOSIS — R300 Dysuria: Secondary | ICD-10-CM

## 2018-01-28 MED ORDER — NITROFURANTOIN MONOHYD MACRO 100 MG PO CAPS: 100.0000 mg | ORAL_CAPSULE | Freq: Two times a day (BID) | ORAL | 0 refills | Status: DC

## 2018-01-28 MED ORDER — ESTRADIOL 0.1 MG/24HR TD PTWK: 0.1000 mg | MEDICATED_PATCH | TRANSDERMAL | 12 refills | Status: DC

## 2018-01-28 NOTE — Progress Notes (Signed)
See Mychart messages

## 2018-02-07 ENCOUNTER — Encounter: Payer: Self-pay | Admitting: Gynecologic Oncology

## 2018-02-08 ENCOUNTER — Encounter: Payer: Self-pay | Admitting: Gynecologic Oncology

## 2018-02-09 ENCOUNTER — Telehealth: Payer: Self-pay

## 2018-02-09 NOTE — Telephone Encounter (Signed)
Returned pt's call, pt confirmed appt for tomorrow at 10 am.  No other needs per pt at this time.

## 2018-02-10 ENCOUNTER — Ambulatory Visit: Payer: BLUE CROSS/BLUE SHIELD | Admitting: Gynecologic Oncology

## 2018-02-10 ENCOUNTER — Encounter: Payer: Self-pay | Admitting: Gynecologic Oncology

## 2018-02-10 ENCOUNTER — Inpatient Hospital Stay: Payer: BLUE CROSS/BLUE SHIELD | Attending: Gynecologic Oncology | Admitting: Gynecologic Oncology

## 2018-02-10 VITALS — BP 157/86 | HR 83 | Temp 98.8°F | Resp 18 | Ht 64.0 in | Wt 275.5 lb

## 2018-02-10 DIAGNOSIS — Z90722 Acquired absence of ovaries, bilateral: Secondary | ICD-10-CM | POA: Diagnosis not present

## 2018-02-10 DIAGNOSIS — Z6841 Body Mass Index (BMI) 40.0 and over, adult: Secondary | ICD-10-CM

## 2018-02-10 DIAGNOSIS — K5900 Constipation, unspecified: Secondary | ICD-10-CM | POA: Diagnosis not present

## 2018-02-10 DIAGNOSIS — N8502 Endometrial intraepithelial neoplasia [EIN]: Secondary | ICD-10-CM

## 2018-02-10 DIAGNOSIS — Z90710 Acquired absence of both cervix and uterus: Secondary | ICD-10-CM | POA: Diagnosis not present

## 2018-02-10 DIAGNOSIS — R1084 Generalized abdominal pain: Secondary | ICD-10-CM

## 2018-02-10 NOTE — Patient Instructions (Signed)
Dr [name] has scheduled you for a CT scan to evaluate your postop pain.   If this does not show abnormalities, it means that your pain is part of normal healing.  You can return to work at 6 weeks postop.  Please follow-up with Dr Hulan Fray for annual checks.  Dr Serita Grit office can be reached at [phone] for questions.
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
## 2018-02-18 ENCOUNTER — Ambulatory Visit (HOSPITAL_COMMUNITY): Payer: BLUE CROSS/BLUE SHIELD

## 2018-02-19 ENCOUNTER — Ambulatory Visit (HOSPITAL_COMMUNITY)
Admission: RE | Admit: 2018-02-19 | Discharge: 2018-02-19 | Disposition: A | Discharge status: Home / Self Care | Payer: BLUE CROSS/BLUE SHIELD | Source: Ambulatory Visit | Attending: Gynecologic Oncology | Admitting: Gynecologic Oncology

## 2018-02-19 ENCOUNTER — Encounter: Payer: Self-pay | Admitting: Gynecologic Oncology

## 2018-02-19 ENCOUNTER — Encounter (HOSPITAL_COMMUNITY): Payer: Self-pay | Admitting: Radiology

## 2018-02-19 DIAGNOSIS — I898 Other specified noninfective disorders of lymphatic vessels and lymph nodes: Secondary | ICD-10-CM | POA: Insufficient documentation

## 2018-02-19 DIAGNOSIS — R1084 Generalized abdominal pain: Secondary | ICD-10-CM | POA: Insufficient documentation

## 2018-02-19 DIAGNOSIS — Z90710 Acquired absence of both cervix and uterus: Secondary | ICD-10-CM | POA: Diagnosis not present

## 2018-02-19 DIAGNOSIS — N200 Calculus of kidney: Secondary | ICD-10-CM | POA: Diagnosis not present

## 2018-02-19 MED ORDER — IOPAMIDOL (ISOVUE-300) INJECTION 61%
100.0000 mL | Freq: Once | INTRAVENOUS | Status: AC | PRN
  Administered 2018-02-19: 100 mL via INTRAVENOUS

## 2018-02-19 MED ORDER — IOPAMIDOL (ISOVUE-300) INJECTION 61%
INTRAVENOUS | Status: AC
  Filled 2018-02-19: qty 100

## 2018-02-22 ENCOUNTER — Telehealth: Payer: Self-pay

## 2018-02-22 NOTE — Telephone Encounter (Signed)
LM stated that the ct scan showed no complications.  Post -op changes seen.   There is a small left kidney stone but not blocking anything per Joylene John, NP. She can call the office at [phone] if any concerns or questions.

## 2018-03-03 ENCOUNTER — Encounter: Payer: Self-pay | Admitting: Gynecologic Oncology

## 2018-03-04 ENCOUNTER — Other Ambulatory Visit: Payer: Self-pay | Admitting: Gynecologic Oncology

## 2018-03-04 ENCOUNTER — Encounter: Payer: Self-pay | Admitting: Gynecologic Oncology

## 2018-03-04 DIAGNOSIS — G8918 Other acute postprocedural pain: Secondary | ICD-10-CM

## 2018-03-04 MED ORDER — TRAMADOL HCL 50 MG PO TABS: 50.0000 mg | ORAL_TABLET | Freq: Four times a day (QID) | ORAL | 0 refills | Status: AC | PRN

## 2018-03-04 NOTE — Progress Notes (Signed)
Refill on tramadol per Dr. [name] who spoke with the patient about her abdominal pain.

## 2018-03-17 ENCOUNTER — Encounter: Payer: Self-pay | Admitting: Gynecologic Oncology

## 2018-03-19 ENCOUNTER — Encounter: Payer: Self-pay | Admitting: Gynecologic Oncology

## 2018-03-24 ENCOUNTER — Telehealth: Payer: Self-pay | Admitting: Gynecologic Oncology

## 2018-03-24 NOTE — Telephone Encounter (Signed)
Left message that I would speak with her next week regarding long term HRT use.

## 2018-03-29 ENCOUNTER — Encounter: Payer: Self-pay | Admitting: Gynecologic Oncology

## 2018-03-30 ENCOUNTER — Other Ambulatory Visit: Payer: Self-pay | Admitting: Gynecologic Oncology

## 2018-03-30 DIAGNOSIS — N951 Menopausal and female climacteric states: Secondary | ICD-10-CM

## 2018-03-30 MED ORDER — ESTRADIOL 0.05 MG/24HR TD PTWK: 0.0500 mg | MEDICATED_PATCH | TRANSDERMAL | 12 refills | Status: AC

## 2018-04-01 ENCOUNTER — Encounter: Payer: Self-pay | Admitting: Gynecologic Oncology

## 2018-04-01 ENCOUNTER — Telehealth: Payer: Self-pay | Admitting: Gynecologic Oncology

## 2018-04-01 NOTE — Telephone Encounter (Signed)
Left message that the next dose of estrogen would be decreased to half at her request. Left message that there is no increased cancer risk if estrogen is stopped at or before 5 years of use beyond age of menopause.  Thereasa Solo, MD

## 2019-03-24 IMAGING — US US TRANSVAGINAL NON-OB
1 series · 13 of 25 positions shown · non-contrast
Comparison: None.

CLINICAL DATA: 51-year-old female presents for evaluation of
reported postmenopausal bleeding.

EXAM:
ULTRASOUND PELVIS TRANSVAGINAL
TECHNIQUE: Transvaginal ultrasound examination of the pelvis was performed
including evaluation of the uterus, ovaries, adnexal regions, and
pelvic cul-de-sac.

[Series 1: us transvaginal non-ob · 0.12mm/px · 13 of 57 slices shown]
[im 1/57]
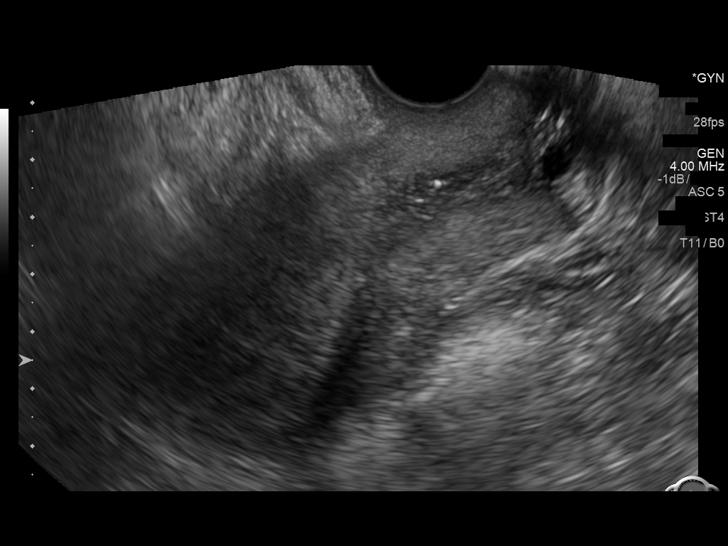
[im 5/57]
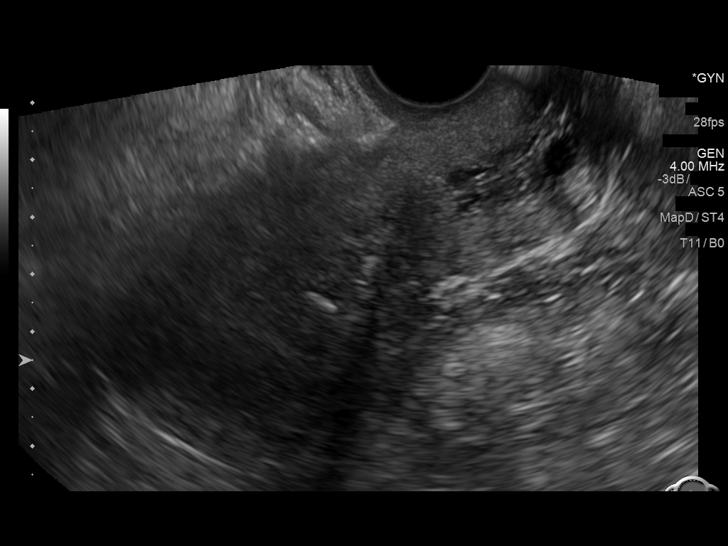
[im 10/57]
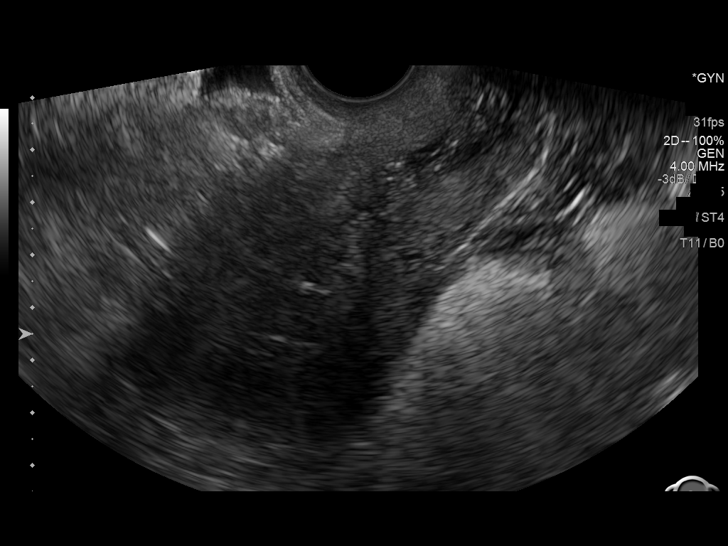
[im 15/57]
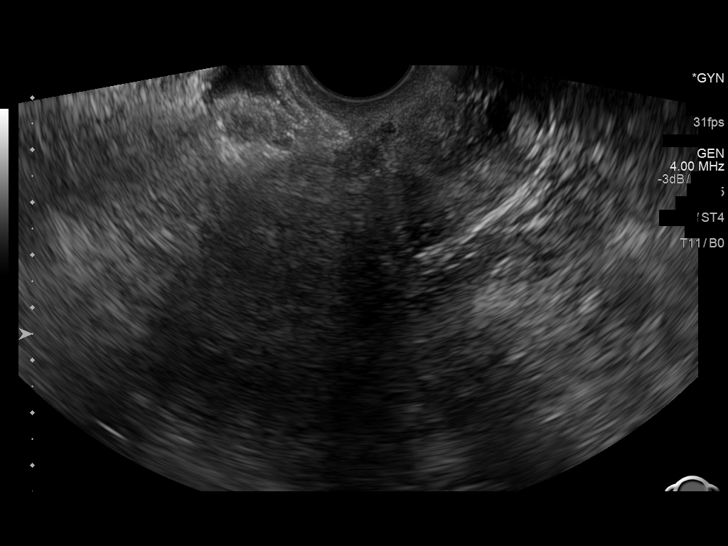
[im 19/57]
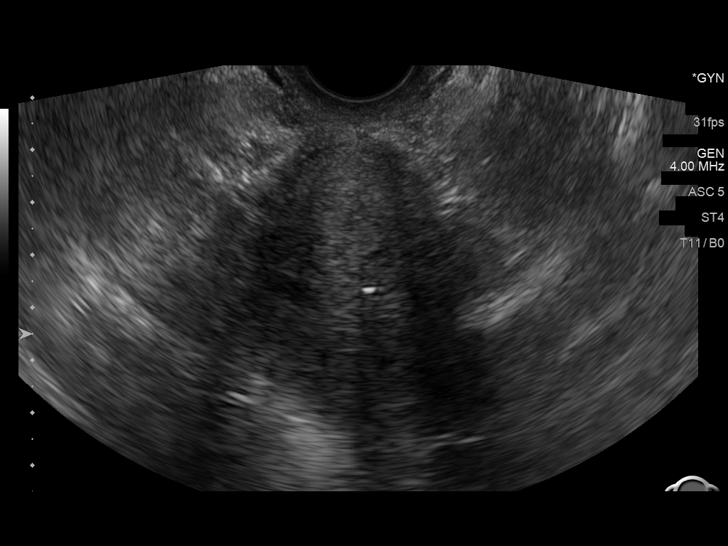
[im 24/57]
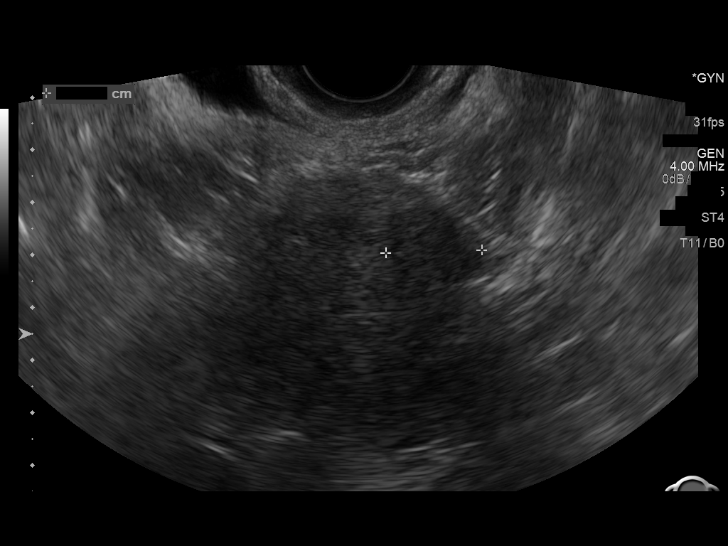
[im 29/57]
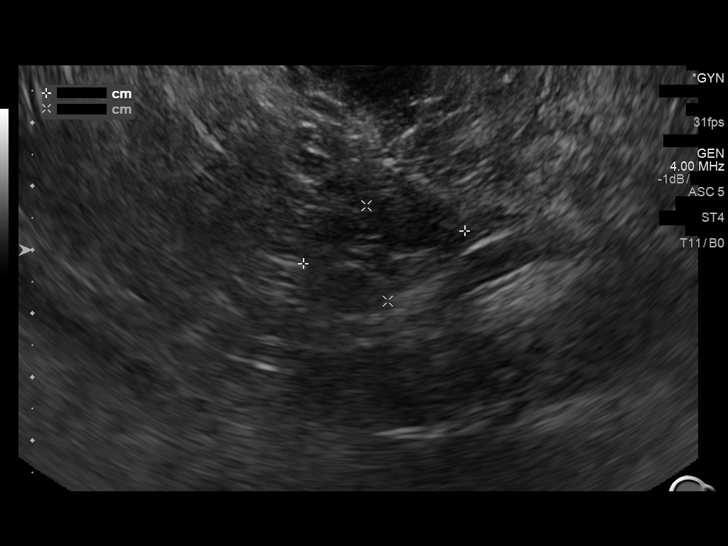
[im 33/57]
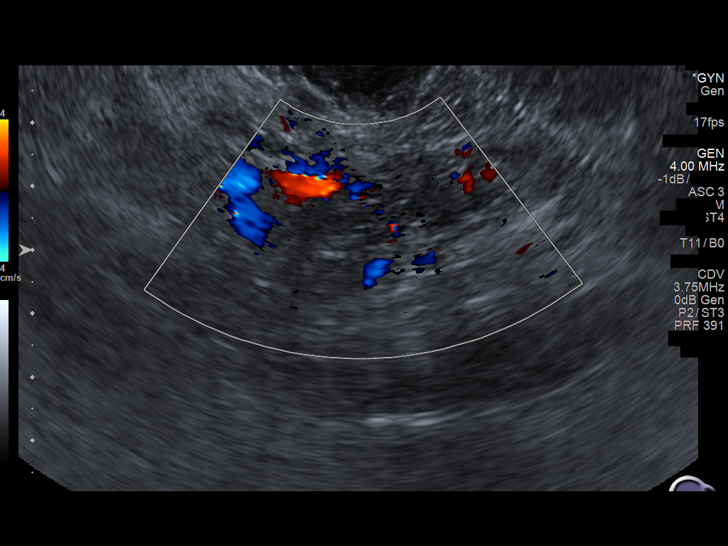
[im 38/57]
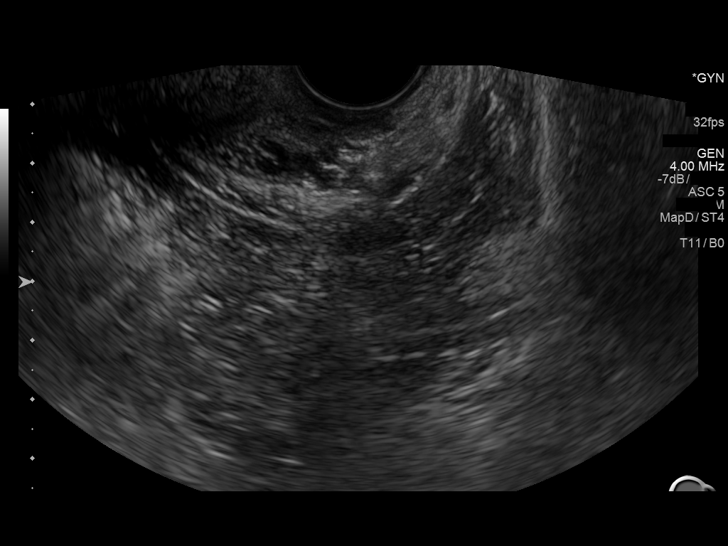
[im 43/57]
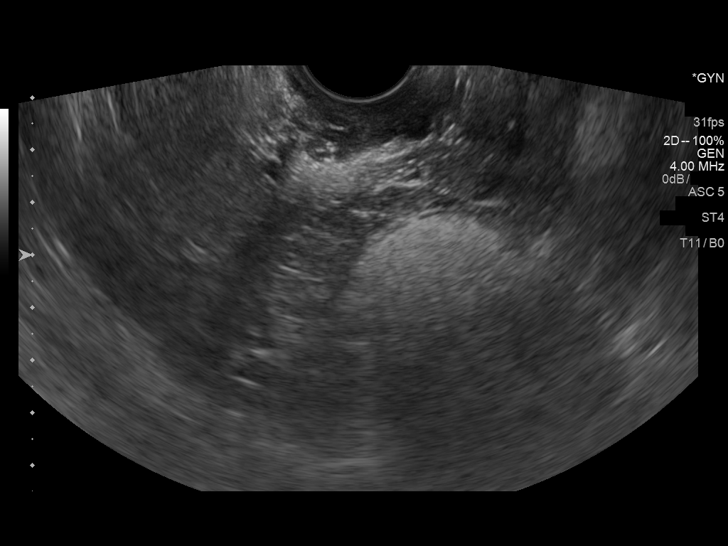
[im 47/57]
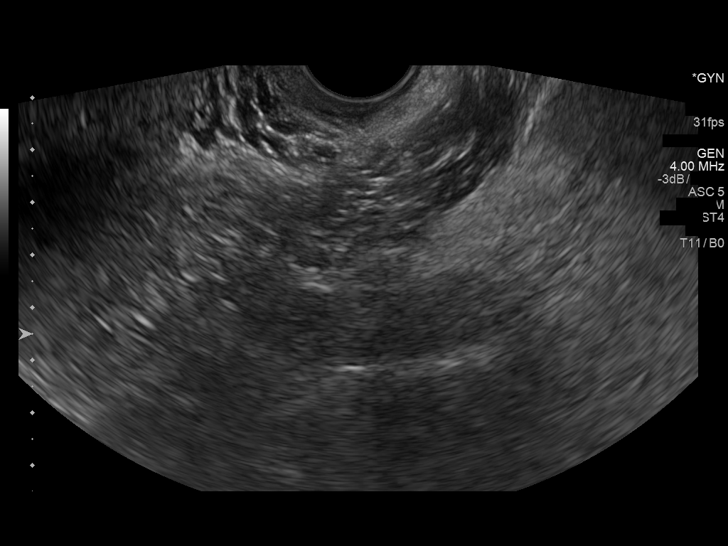
[im 52/57]
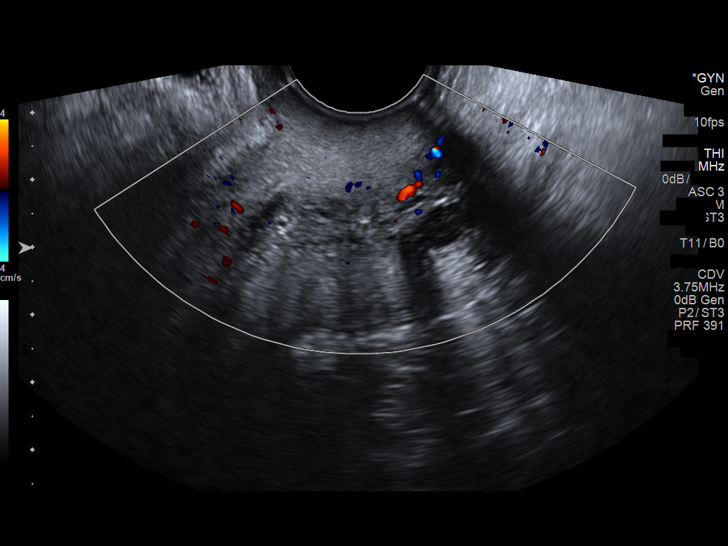
[im 57/57]
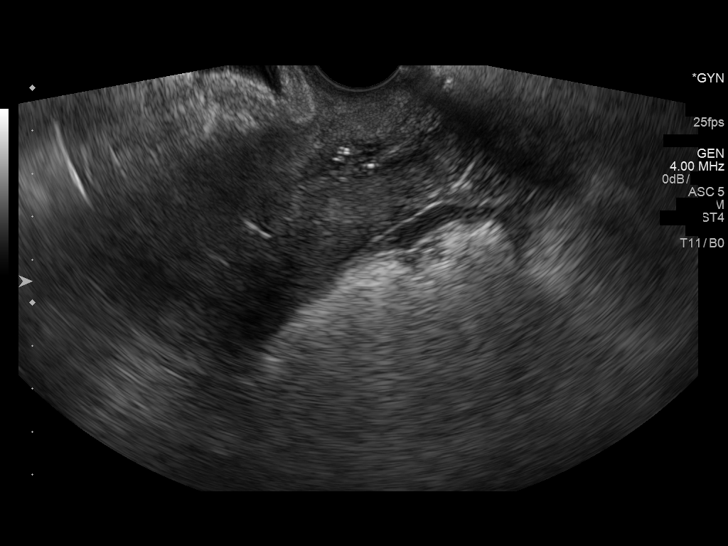

[13 of 25 positions shown; findings below may reference images not displayed]

FINDINGS: Uterus

Measurements: 8.9 x 4.8 x 5.4 cm. The anteverted uterus is mildly
enlarged. The endometrial myometrial interface is indistinct. The
myometrium is heterogeneous. These findings suggest diffuse
adenomyosis of the uterus. There is a questionable intramural 1.6 x
1.6 x 1.8 cm fibroid in the posterior lower left uterine body.

Endometrium

Thickness: 9 mm. Heterogeneous indistinct endometrium. No
endometrial cavity fluid or focal endometrial mass demonstrated.

Right ovary

Nonvisualization of the right ovary.  No right adnexal masses.

Left ovary

Measurements: 2.6 x 1.5 x 1.8 cm. Normal appearance/no adnexal mass.

Other findings:  No abnormal free fluid
IMPRESSION: 1. Heterogeneous abnormally thickened (9 mm) endometrium. In the
setting of post-menopausal bleeding, endometrial sampling is
indicated to exclude carcinoma. If results are benign,
sonohysterogram should be considered for focal lesion work-up. (Ref:
Radiological Reasoning: Algorithmic Workup of Abnormal Vaginal
Bleeding with Endovaginal Sonography and Sonohysterography. AJR
3007; 191:S68-73).
2. Spectrum of findings suggestive of diffuse adenomyosis of the
uterus, see comments. Questionable small intramural fibroid in the
posterior lower left uterine body.
3. Nonvisualization of the right ovary. Normal left ovary. No
adnexal masses.

## 2020-04-12 IMAGING — CT CT ABD-PELV W/ CM
2 of 5 series · 17 of 46 positions shown, 19 images · IV contrast (ISOVUE 300)
Comparison: None.

CLINICAL DATA: Abdominal pain and fever. One month postop from
supracervical hysterectomy, bilateral salpingo oophorectomy, and
sentinel lymph node dissection. Suspected abscess.

EXAM:
CT ABDOMEN AND PELVIS WITH CONTRAST
TECHNIQUE: Multidetector CT imaging of the abdomen and pelvis was performed
using the standard protocol following bolus administration of
intravenous contrast.
CONTRAST:  100mL [id number] IOPAMIDOL (HY6FLY-488) INJECTION 61%

[Series 2: axial st · axial · 0.82mm/px · z∈[-518,-118]mm · 14 of 92 slices shown, 16 images]
[im 6/92  soft-tissue]
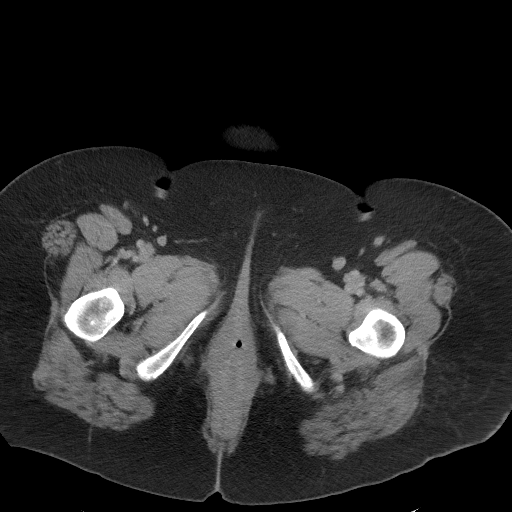
[im 6/92  bone]
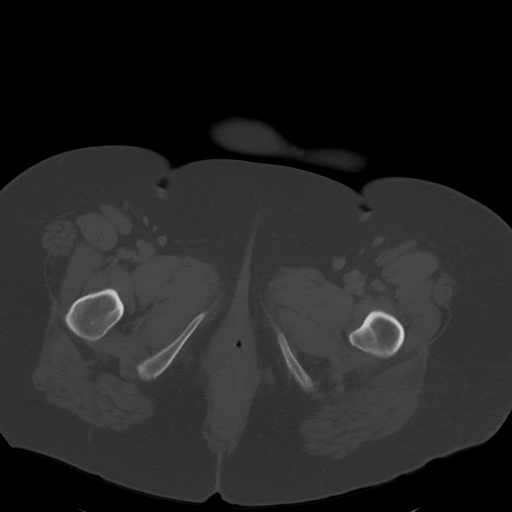
[im 12/92  soft-tissue]
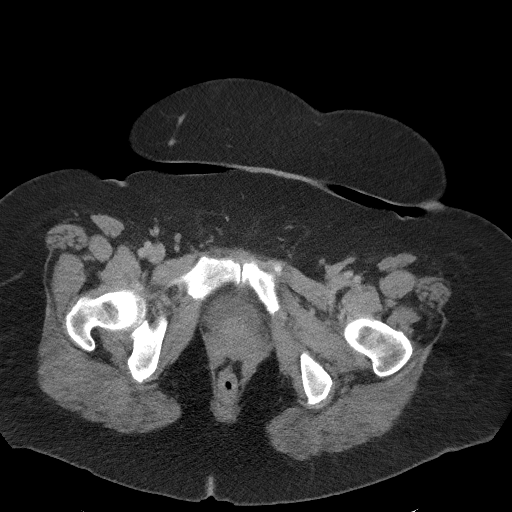
[im 18/92  soft-tissue]
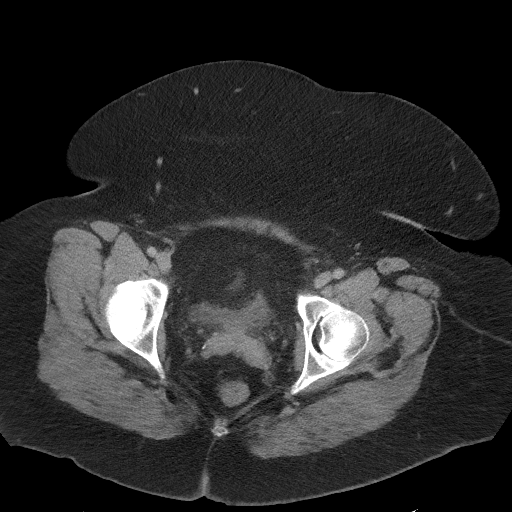
[im 23/92  soft-tissue]
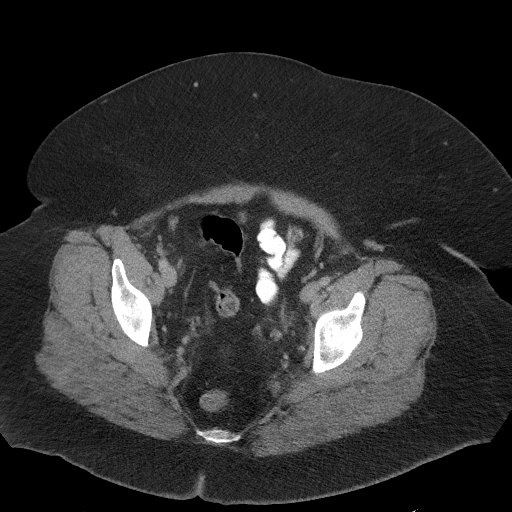
[im 29/92  soft-tissue]
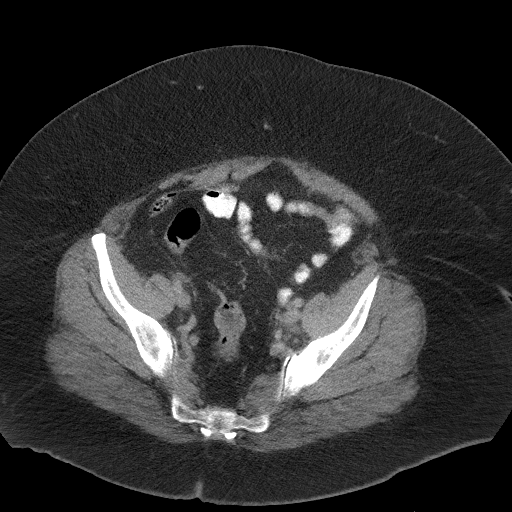
[im 35/92  soft-tissue]
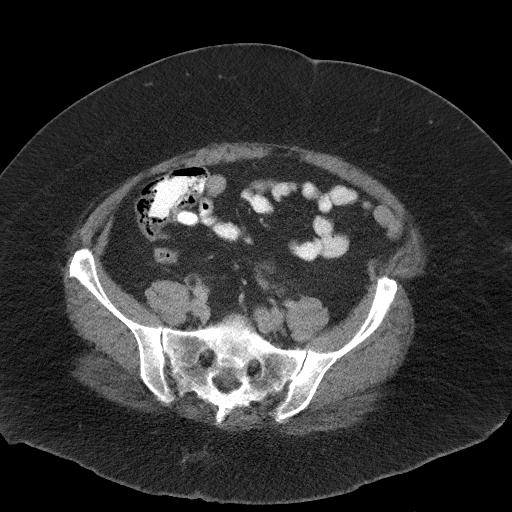
[im 40/92  soft-tissue]
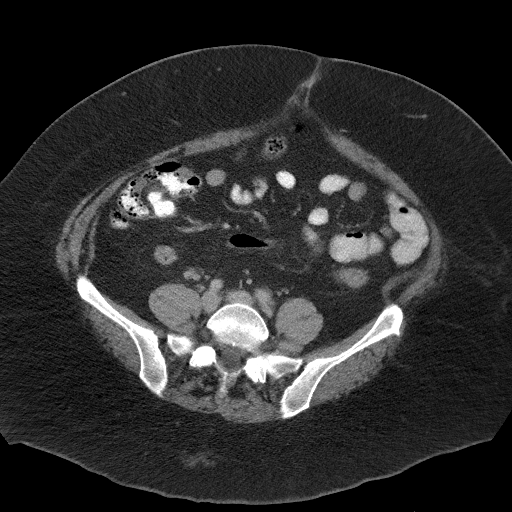
[im 52/92  soft-tissue]
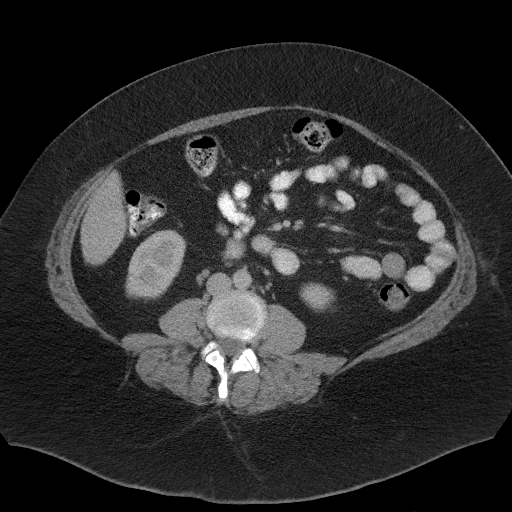
[im 57/92  soft-tissue]
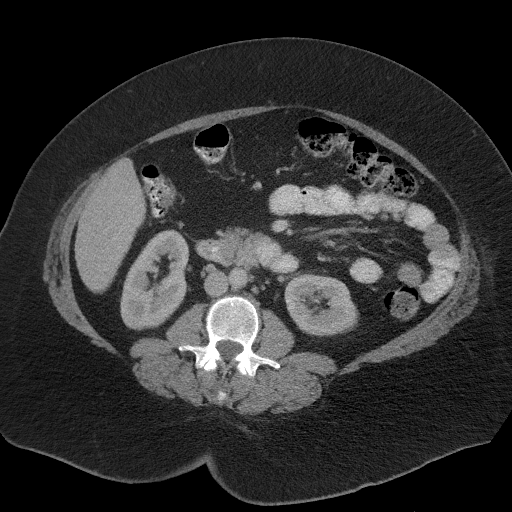
[im 57/92  bone]
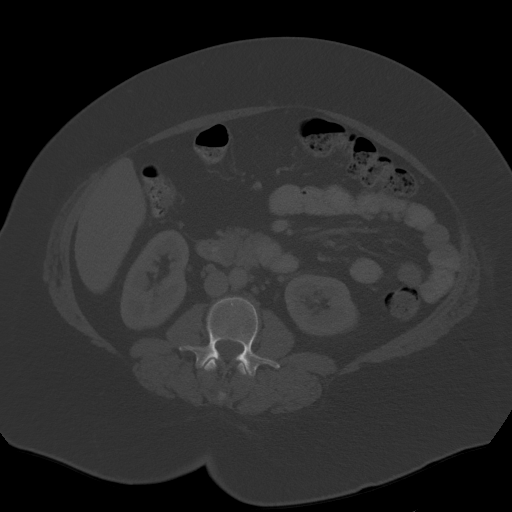
[im 63/92  soft-tissue]
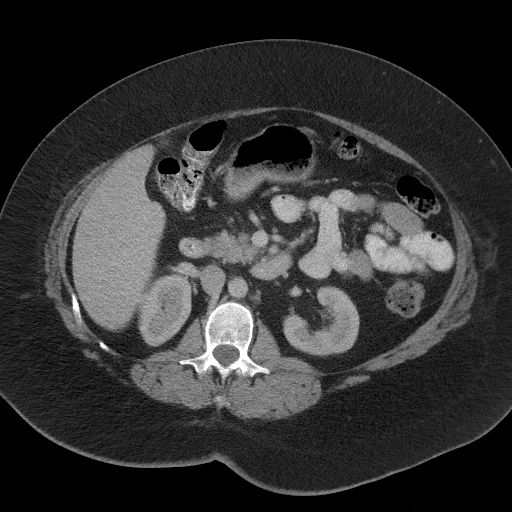
[im 69/92  soft-tissue]
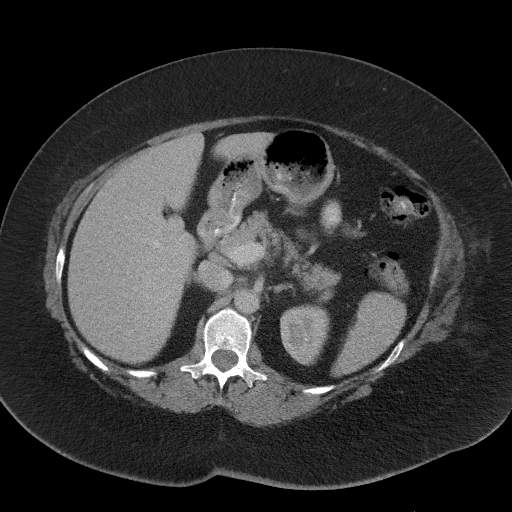
[im 74/92  soft-tissue]
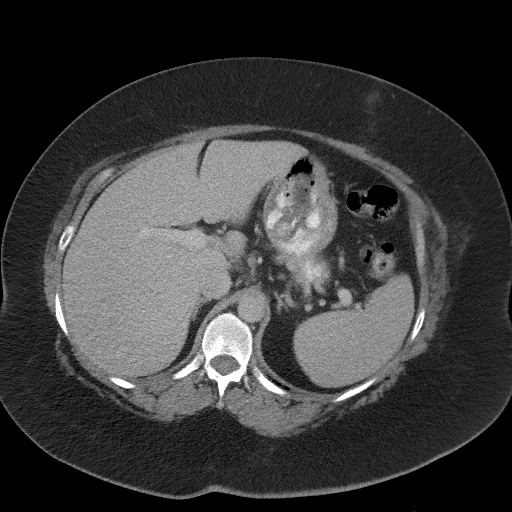
[im 80/92  soft-tissue]
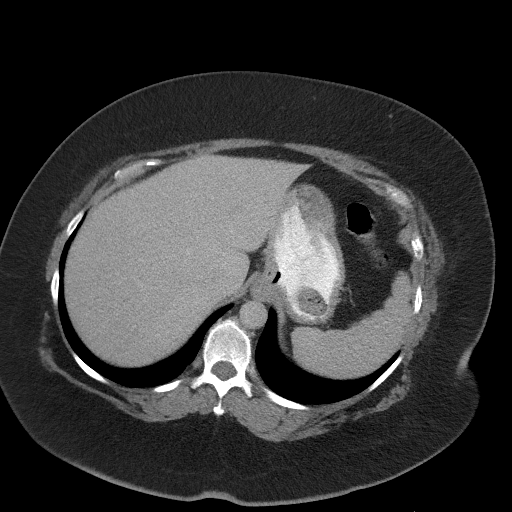
[im 86/92  soft-tissue]
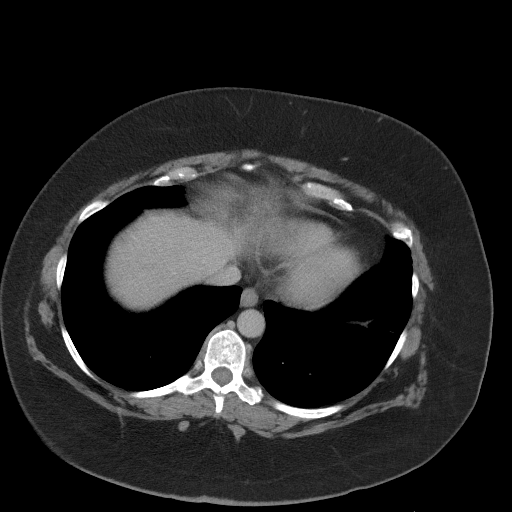

[Series 4: coronal st · coronal · 0.96mm/px · 3 of 103 slices shown]
[im 35/103  soft-tissue]
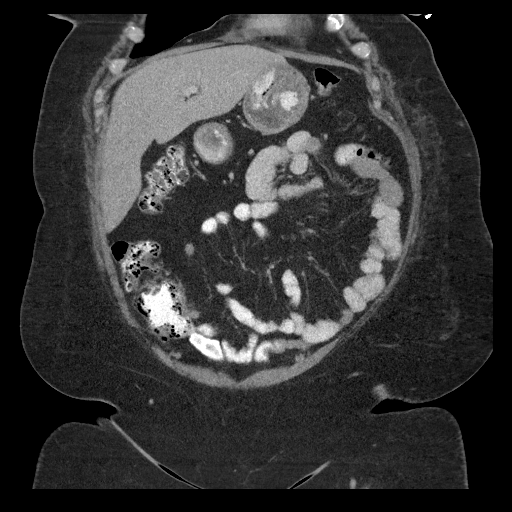
[im 46/103  soft-tissue]
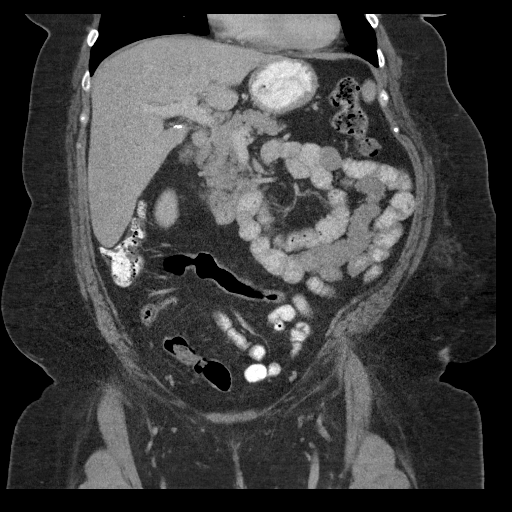
[im 57/103  soft-tissue]
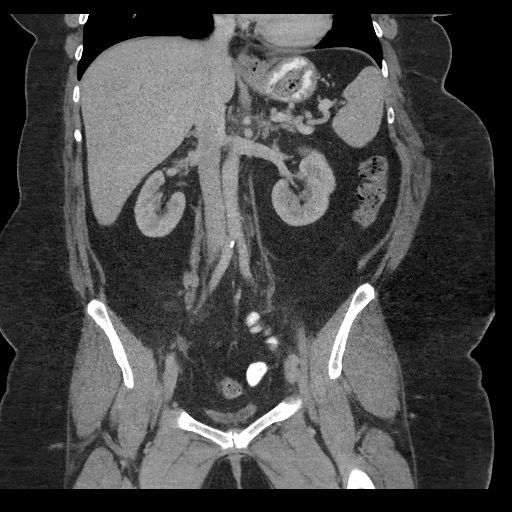

[17 of 46 positions shown; findings below may reference images not displayed]

FINDINGS: Lower Chest: No acute findings.

Hepatobiliary: No hepatic masses identified. Prior cholecystectomy.
No evidence of biliary obstruction.

Pancreas:  No mass or inflammatory changes.

Spleen: Within normal limits in size and appearance.

Adrenals/Urinary Tract: No masses identified. A 3 mm nonobstructing
calculus is seen in the lower pole of the left kidney. No evidence
of ureteral calculi or hydronephrosis. Unremarkable unopacified
urinary bladder.

Stomach/Bowel: No evidence of obstruction, inflammatory process or
abnormal fluid collections.

Vascular/Lymphatic: No pathologically enlarged lymph nodes. No
abdominal aortic aneurysm.

Reproductive: Expected postop changes from hysterectomy. Small
low-attenuation lesions are seen adjacent to the external iliac
vessels bilaterally, measuring 1.7 cm on the right and 1.6 cm on the
left. These may represent tiny postop lymphoceles or reactive lymph
nodes. No abscess identified.

Other:  None.

Musculoskeletal: Small sclerotic lesions are seen in the T10
vertebral body and left superior pubic ramus, most likely
representing benign bone islands.
IMPRESSION: Postop changes from hysterectomy. Small postop lymphoceles or
reactive lymph nodes in both external iliac regions. No abscess or
other complication identified.

Tiny nonobstructing left renal calculus.
